# Patient Record
Sex: Female | Born: 1985 | Race: White | Hispanic: No | Marital: Married | Smoking: Former smoker
Health system: Southern US, Community
[De-identification: ages and names within clinical notes are randomized; demographics above are authoritative.]

## PROBLEM LIST (undated history)

## (undated) ENCOUNTER — Inpatient Hospital Stay (HOSPITAL_COMMUNITY): Payer: Self-pay

## (undated) ENCOUNTER — Emergency Department (HOSPITAL_COMMUNITY): Admission: EM | Payer: Managed Care, Other (non HMO) | Source: Home / Self Care

## (undated) DIAGNOSIS — N200 Calculus of kidney: Secondary | ICD-10-CM

## (undated) DIAGNOSIS — F32A Depression, unspecified: Secondary | ICD-10-CM

## (undated) DIAGNOSIS — R799 Abnormal finding of blood chemistry, unspecified: Secondary | ICD-10-CM

## (undated) DIAGNOSIS — F329 Major depressive disorder, single episode, unspecified: Secondary | ICD-10-CM

## (undated) DIAGNOSIS — E282 Polycystic ovarian syndrome: Secondary | ICD-10-CM

## (undated) DIAGNOSIS — Z8619 Personal history of other infectious and parasitic diseases: Secondary | ICD-10-CM

## (undated) DIAGNOSIS — O468X1 Other antepartum hemorrhage, first trimester: Secondary | ICD-10-CM

## (undated) DIAGNOSIS — M549 Dorsalgia, unspecified: Secondary | ICD-10-CM

## (undated) DIAGNOSIS — L821 Other seborrheic keratosis: Secondary | ICD-10-CM

## (undated) DIAGNOSIS — K649 Unspecified hemorrhoids: Secondary | ICD-10-CM

## (undated) DIAGNOSIS — O418X10 Other specified disorders of amniotic fluid and membranes, first trimester, not applicable or unspecified: Secondary | ICD-10-CM

## (undated) DIAGNOSIS — O209 Hemorrhage in early pregnancy, unspecified: Secondary | ICD-10-CM

## (undated) DIAGNOSIS — R7989 Other specified abnormal findings of blood chemistry: Secondary | ICD-10-CM

## (undated) DIAGNOSIS — O9902 Anemia complicating childbirth: Secondary | ICD-10-CM

## (undated) DIAGNOSIS — R51 Headache: Secondary | ICD-10-CM

## (undated) DIAGNOSIS — M797 Fibromyalgia: Secondary | ICD-10-CM

## (undated) HISTORY — DX: Abnormal finding of blood chemistry, unspecified: R79.9

## (undated) HISTORY — DX: Unspecified hemorrhoids: K64.9

## (undated) HISTORY — PX: KNEE SURGERY: SHX244

## (undated) HISTORY — DX: Other specified abnormal findings of blood chemistry: R79.89

## (undated) HISTORY — DX: Personal history of other infectious and parasitic diseases: Z86.19

## (undated) HISTORY — PX: WISDOM TOOTH EXTRACTION: SHX21

## (undated) HISTORY — DX: Dorsalgia, unspecified: M54.9

## (undated) HISTORY — DX: Fibromyalgia: M79.7

## (undated) HISTORY — DX: Other seborrheic keratosis: L82.1

---
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
## 2004-06-12 ENCOUNTER — Emergency Department (HOSPITAL_COMMUNITY): Admission: EM | Admit: 2004-06-12 | Discharge: 2004-06-12 | Payer: Self-pay | Admitting: Emergency Medicine

## 2004-08-08 ENCOUNTER — Ambulatory Visit (HOSPITAL_COMMUNITY): Admission: RE | Admit: 2004-08-08 | Discharge: 2004-08-08 | Payer: Self-pay | Admitting: *Deleted

## 2005-09-30 ENCOUNTER — Other Ambulatory Visit: Admission: RE | Admit: 2005-09-30 | Discharge: 2005-09-30 | Payer: Self-pay | Admitting: Family Medicine

## 2005-10-07 ENCOUNTER — Encounter: Admission: RE | Admit: 2005-10-07 | Discharge: 2005-10-07 | Payer: Self-pay | Admitting: Family Medicine

## 2005-10-24 ENCOUNTER — Emergency Department (HOSPITAL_COMMUNITY): Admission: EM | Admit: 2005-10-24 | Discharge: 2005-10-24 | Payer: Self-pay | Admitting: Emergency Medicine

## 2008-07-29 ENCOUNTER — Emergency Department (HOSPITAL_COMMUNITY): Admission: EM | Admit: 2008-07-29 | Discharge: 2008-07-29 | Payer: Self-pay | Admitting: Emergency Medicine

## 2008-09-17 ENCOUNTER — Emergency Department (HOSPITAL_COMMUNITY): Admission: EM | Admit: 2008-09-17 | Discharge: 2008-09-17 | Payer: Self-pay | Admitting: Emergency Medicine

## 2008-12-12 ENCOUNTER — Other Ambulatory Visit: Admission: RE | Admit: 2008-12-12 | Discharge: 2008-12-12 | Payer: Self-pay | Admitting: Obstetrics and Gynecology

## 2009-01-03 ENCOUNTER — Emergency Department (HOSPITAL_COMMUNITY): Admission: EM | Admit: 2009-01-03 | Discharge: 2009-01-03 | Payer: Self-pay | Admitting: Emergency Medicine

## 2009-09-29 ENCOUNTER — Emergency Department (HOSPITAL_COMMUNITY): Admission: EM | Admit: 2009-09-29 | Discharge: 2009-09-29 | Payer: Self-pay | Admitting: Emergency Medicine

## 2010-12-16 ENCOUNTER — Emergency Department (HOSPITAL_COMMUNITY)
Admission: EM | Admit: 2010-12-16 | Discharge: 2010-12-16 | Payer: Self-pay | Source: Home / Self Care | Admitting: Emergency Medicine

## 2010-12-17 LAB — CBC
HCT: 37.8 % (ref 36.0–46.0)
Hemoglobin: 12.8 g/dL (ref 12.0–15.0)
MCH: 29.5 pg (ref 26.0–34.0)
MCHC: 33.9 g/dL (ref 30.0–36.0)
MCV: 87.1 fL (ref 78.0–100.0)
Platelets: 294 10*3/uL (ref 150–400)
RBC: 4.34 MIL/uL (ref 3.87–5.11)
RDW: 13.4 % (ref 11.5–15.5)
WBC: 15.8 10*3/uL — ABNORMAL HIGH (ref 4.0–10.5)

## 2010-12-17 LAB — DIFFERENTIAL
Basophils Absolute: 0 10*3/uL (ref 0.0–0.1)
Basophils Relative: 0 % (ref 0–1)
Eosinophils Absolute: 0.1 10*3/uL (ref 0.0–0.7)
Eosinophils Relative: 0 % (ref 0–5)
Lymphocytes Relative: 10 % — ABNORMAL LOW (ref 12–46)
Lymphs Abs: 1.6 10*3/uL (ref 0.7–4.0)
Monocytes Absolute: 0.7 10*3/uL (ref 0.1–1.0)
Monocytes Relative: 5 % (ref 3–12)
Neutro Abs: 13.4 10*3/uL — ABNORMAL HIGH (ref 1.7–7.7)
Neutrophils Relative %: 85 % — ABNORMAL HIGH (ref 43–77)

## 2010-12-17 LAB — URINE MICROSCOPIC-ADD ON

## 2010-12-17 LAB — URINALYSIS, ROUTINE W REFLEX MICROSCOPIC
Bilirubin Urine: NEGATIVE
Leukocytes, UA: NEGATIVE
Nitrite: NEGATIVE
Protein, ur: NEGATIVE mg/dL
Specific Gravity, Urine: >1.03 — ABNORMAL HIGH (ref 1.005–1.030)
Urine Glucose, Fasting: NEGATIVE mg/dL
Urobilinogen, UA: 0.2 mg/dL (ref 0.0–1.0)
pH: 5 (ref 5.0–8.0)

## 2010-12-17 LAB — BASIC METABOLIC PANEL
BUN: 21 mg/dL (ref 6–23)
CO2: 25 mEq/L (ref 19–32)
Calcium: 8.5 mg/dL (ref 8.4–10.5)
Chloride: 107 mEq/L (ref 96–112)
Creatinine, Ser: 0.99 mg/dL (ref 0.4–1.2)
GFR calc Af Amer: >60 mL/min (ref >60)
GFR calc non Af Amer: >60 mL/min (ref >60)
Glucose, Bld: 117 mg/dL — ABNORMAL HIGH (ref 70–99)
Potassium: 4.5 mEq/L (ref 3.5–5.1)
Sodium: 138 mEq/L (ref 135–145)

## 2010-12-17 LAB — GLUCOSE, CAPILLARY: Glucose-Capillary: 111 mg/dL — ABNORMAL HIGH (ref 70–99)

## 2010-12-17 LAB — POCT PREGNANCY, URINE: Preg Test, Ur: NEGATIVE

## 2011-02-06 ENCOUNTER — Emergency Department (HOSPITAL_COMMUNITY)
Admission: EM | Admit: 2011-02-06 | Discharge: 2011-02-06 | Disposition: A | Discharge status: Home / Self Care | Payer: Managed Care, Other (non HMO) | Attending: Emergency Medicine | Admitting: Emergency Medicine

## 2011-02-06 ENCOUNTER — Emergency Department (HOSPITAL_COMMUNITY): Payer: Managed Care, Other (non HMO)

## 2011-02-06 DIAGNOSIS — N289 Disorder of kidney and ureter, unspecified: Secondary | ICD-10-CM | POA: Insufficient documentation

## 2011-02-06 DIAGNOSIS — N201 Calculus of ureter: Secondary | ICD-10-CM | POA: Insufficient documentation

## 2011-02-06 DIAGNOSIS — Z8742 Personal history of other diseases of the female genital tract: Secondary | ICD-10-CM | POA: Insufficient documentation

## 2011-02-06 DIAGNOSIS — R110 Nausea: Secondary | ICD-10-CM | POA: Insufficient documentation

## 2011-02-06 DIAGNOSIS — R109 Unspecified abdominal pain: Secondary | ICD-10-CM | POA: Insufficient documentation

## 2011-02-06 LAB — BASIC METABOLIC PANEL
BUN: 12 mg/dL (ref 6–23)
CO2: 23 mEq/L (ref 19–32)
Calcium: 9.3 mg/dL (ref 8.4–10.5)
Chloride: 103 mEq/L (ref 96–112)
Creatinine, Ser: 1.41 mg/dL — ABNORMAL HIGH (ref 0.4–1.2)
GFR calc Af Amer: 55 mL/min — ABNORMAL LOW (ref >60)
GFR calc non Af Amer: 45 mL/min — ABNORMAL LOW (ref >60)
Glucose, Bld: 104 mg/dL — ABNORMAL HIGH (ref 70–99)
Potassium: 4.1 mEq/L (ref 3.5–5.1)
Sodium: 135 mEq/L (ref 135–145)

## 2011-02-06 LAB — CBC
HCT: 35.4 % — ABNORMAL LOW (ref 36.0–46.0)
Hemoglobin: 11.9 g/dL — ABNORMAL LOW (ref 12.0–15.0)
MCH: 28.7 pg (ref 26.0–34.0)
MCHC: 33.6 g/dL (ref 30.0–36.0)
MCV: 85.5 fL (ref 78.0–100.0)
Platelets: 390 10*3/uL (ref 150–400)
RBC: 4.14 MIL/uL (ref 3.87–5.11)
RDW: 12.3 % (ref 11.5–15.5)
WBC: 13.4 10*3/uL — ABNORMAL HIGH (ref 4.0–10.5)

## 2011-02-06 LAB — DIFFERENTIAL
Basophils Absolute: 0 K/uL (ref 0.0–0.1)
Basophils Relative: 0 % (ref 0–1)
Eosinophils Absolute: 0 10*3/uL (ref 0.0–0.7)
Eosinophils Relative: 0 % (ref 0–5)
Lymphocytes Relative: 20 % (ref 12–46)
Lymphs Abs: 2.7 10*3/uL (ref 0.7–4.0)
Monocytes Absolute: 1 10*3/uL (ref 0.1–1.0)
Monocytes Relative: 7 % (ref 3–12)
Neutro Abs: 9.6 10*3/uL — ABNORMAL HIGH (ref 1.7–7.7)
Neutrophils Relative %: 72 % (ref 43–77)

## 2011-02-06 LAB — URINALYSIS, ROUTINE W REFLEX MICROSCOPIC
Bilirubin Urine: NEGATIVE
Glucose, UA: NEGATIVE mg/dL
Hgb urine dipstick: NEGATIVE
Ketones, ur: NEGATIVE mg/dL
Nitrite: NEGATIVE
Protein, ur: NEGATIVE mg/dL
Specific Gravity, Urine: 1.015 (ref 1.005–1.030)
Urobilinogen, UA: 0.2 mg/dL (ref 0.0–1.0)
pH: 5 (ref 5.0–8.0)

## 2011-02-06 LAB — D-DIMER, QUANTITATIVE: D-Dimer, Quant: 1.39 ug/mL-FEU — ABNORMAL HIGH (ref 0.00–0.48)

## 2011-02-10 ENCOUNTER — Emergency Department (HOSPITAL_COMMUNITY)
Admission: EM | Admit: 2011-02-10 | Discharge: 2011-02-10 | Disposition: A | Discharge status: Home / Self Care | Payer: Managed Care, Other (non HMO) | Attending: Emergency Medicine | Admitting: Emergency Medicine

## 2011-02-10 DIAGNOSIS — Z79899 Other long term (current) drug therapy: Secondary | ICD-10-CM | POA: Insufficient documentation

## 2011-02-10 DIAGNOSIS — N200 Calculus of kidney: Secondary | ICD-10-CM | POA: Insufficient documentation

## 2011-02-10 DIAGNOSIS — R109 Unspecified abdominal pain: Secondary | ICD-10-CM | POA: Insufficient documentation

## 2011-02-10 LAB — URINALYSIS, ROUTINE W REFLEX MICROSCOPIC
Bilirubin Urine: NEGATIVE
Glucose, UA: NEGATIVE mg/dL
Ketones, ur: NEGATIVE mg/dL
Leukocytes, UA: NEGATIVE
Nitrite: NEGATIVE
Protein, ur: NEGATIVE mg/dL
Specific Gravity, Urine: 1.02 (ref 1.005–1.030)
Urobilinogen, UA: 0.2 mg/dL (ref 0.0–1.0)
pH: 5.5 (ref 5.0–8.0)

## 2011-02-10 LAB — URINE MICROSCOPIC-ADD ON

## 2011-02-10 LAB — POCT PREGNANCY, URINE: Preg Test, Ur: NEGATIVE

## 2011-03-05 LAB — URINALYSIS, ROUTINE W REFLEX MICROSCOPIC
Bilirubin Urine: NEGATIVE
Nitrite: POSITIVE — AB
Specific Gravity, Urine: 1.015 (ref 1.005–1.030)
Urobilinogen, UA: 0.2 mg/dL (ref 0.0–1.0)
pH: 6 (ref 5.0–8.0)

## 2011-03-05 LAB — DIFFERENTIAL
Eosinophils Absolute: 0 10*3/uL (ref 0.0–0.7)
Lymphocytes Relative: 9 % — ABNORMAL LOW (ref 12–46)
Lymphs Abs: 1.2 10*3/uL (ref 0.7–4.0)
Monocytes Relative: 9 % (ref 3–12)
Neutro Abs: 11.1 10*3/uL — ABNORMAL HIGH (ref 1.7–7.7)
Neutrophils Relative %: 82 % — ABNORMAL HIGH (ref 43–77)

## 2011-03-05 LAB — CBC: MCV: 85.3 fL (ref 78.0–100.0)

## 2011-03-05 LAB — PREGNANCY, URINE: Preg Test, Ur: NEGATIVE

## 2011-03-05 LAB — BASIC METABOLIC PANEL
BUN: 6 mg/dL (ref 6–23)
CO2: 23 mEq/L (ref 19–32)
Calcium: 8.3 mg/dL — ABNORMAL LOW (ref 8.4–10.5)
Glucose, Bld: 112 mg/dL — ABNORMAL HIGH (ref 70–99)
Potassium: 3.7 mEq/L (ref 3.5–5.1)
Sodium: 132 mEq/L — ABNORMAL LOW (ref 135–145)

## 2011-03-05 LAB — URINE MICROSCOPIC-ADD ON

## 2011-03-17 LAB — URINALYSIS, ROUTINE W REFLEX MICROSCOPIC
Bilirubin Urine: NEGATIVE
Ketones, ur: 15 mg/dL — AB
Nitrite: NEGATIVE
Urobilinogen, UA: 0.2 mg/dL (ref 0.0–1.0)

## 2011-03-17 LAB — CBC
HCT: 39.8 % (ref 36.0–46.0)
Hemoglobin: 13.5 g/dL (ref 12.0–15.0)
MCHC: 33.9 g/dL (ref 30.0–36.0)
MCV: 85.5 fL (ref 78.0–100.0)
RBC: 4.65 MIL/uL (ref 3.87–5.11)
RDW: 13.4 % (ref 11.5–15.5)

## 2011-03-17 LAB — BASIC METABOLIC PANEL
CO2: 22 mEq/L (ref 19–32)
Chloride: 104 mEq/L (ref 96–112)
Glucose, Bld: 120 mg/dL — ABNORMAL HIGH (ref 70–99)
Potassium: 3.8 mEq/L (ref 3.5–5.1)
Sodium: 138 mEq/L (ref 135–145)

## 2011-03-17 LAB — DIFFERENTIAL
Basophils Absolute: 0 10*3/uL (ref 0.0–0.1)
Basophils Relative: 0 % (ref 0–1)
Eosinophils Relative: 0 % (ref 0–5)
Monocytes Absolute: 0.4 10*3/uL (ref 0.1–1.0)
Monocytes Relative: 4 % (ref 3–12)

## 2011-03-17 LAB — URINE MICROSCOPIC-ADD ON

## 2011-04-17 NOTE — Cardiovascular Report (Signed)
NAMELiliane Mercer, Adonna                              ACCOUNT NO.:  [id number]   MEDICAL RECORD NO.:  [id number]                   PATIENT TYPE:  OIB   LOCATION:  2899                                 FACILITY:  [facility]   PHYSICIAN:  Darlin Priestly, M.D.             DATE OF BIRTH:  [birth year]   DATE OF PROCEDURE:  08/08/2004  DATE OF DISCHARGE:  08/08/2004                              CARDIAC CATHETERIZATION   PROCEDURE:  Head-up tilt-table test with __________ infusion.   ATTENDING:  Dr. Lenise Herald.   INDICATIONS:  [name] is a 25 year old female, patient of Dr. Vonna Kotyk R.  Jacinto Halim, Dr. Louanna Raw, and Dr. Marny Lowenstein with a history of anxiety  disorder, initially seen for chest pain and shortness of breath.  She also  is complaining of at least three episodes where she felt her heart was  racing, and she became presyncopal.   Dictation ended at this point.                                               Darlin Priestly, M.D.    RHM/MEDQ  D:  08/08/2004  T:  08/09/2004  Job:  [id number]

## 2011-04-17 NOTE — Cardiovascular Report (Signed)
NAME:  Vanessa Mercer, Vanessa Mercer                              ACCOUNT NO.:  [id number]   MEDICAL RECORD NO.:  [id number]                   PATIENT TYPE:  OIB   LOCATION:  2899                                 FACILITY:  [facility]   PHYSICIAN:  Darlin Priestly, M.D.             DATE OF BIRTH:  1986-04-14   DATE OF PROCEDURE:  DATE OF DISCHARGE:  08/08/2004                              CARDIAC CATHETERIZATION   PROCEDURE:  Head-up tilt table testing with Isuprel infusion.   ATTENDING PHYSICIAN:  Dr. Lenise Herald.   INDICATION:  [name] is a 25 year old female patient of Dr. Louanna Raw,  Dr. Yates Decamp, initially referred to him for chest pain, tachy palpitations  and presyncopal symptoms.   She did subsequently undergo Cardiolite scan in August of 2005 revealing no  significant ischemia with a normal EF.  She is now referred for tilt-table  testing to rule out neurocardiogenic syncope.   DESCRIPTION OF PROCEDURE:  After giving informed consent, the patient was  brought to the cardiac catheterization laboratory in a fasting state.  The  patient was then placed in the supine position, and hemodynamic measurements  were obtained.  The resting heart rate was 72.  The resting blood pressure  was 113/67.  The patient was then tilted to 70-degree heads up position  which was maintained for 30 minutes.  She remained hemodynamically stable  with no symptoms.  The patient was then returned to a supine position, and  Isuprel infusion was then begun at 0.5 micrograms per minute.  This was  ultimately up-titrated to 1 microgram per minute.  After approximately 10  minutes, the patient was then returned to a 70-degree head-up position.  She  again remained hemodynamically stable with no significant drop in blood  pressure.  She did complain of some mild nausea, but no presyncopal  symptoms.  Isuprel infusion was then discontinued.  The patient returned to  a supine position.  She remained hemodynamically  stable.  She was returned  to the recovery room in stable condition.   CONCLUSION:  Negative head-up tilt-table testing with Isuprel infusion.                                               Darlin Priestly, M.D.    RHM/MEDQ  D:  08/08/2004  T:  08/09/2004  Job:  [id number]

## 2011-07-02 LAB — GC/CHLAMYDIA PROBE AMP, GENITAL: Chlamydia: NEGATIVE

## 2011-07-02 LAB — HEPATITIS B SURFACE ANTIGEN: Hepatitis B Surface Ag: NEGATIVE

## 2011-07-02 LAB — ABO/RH: RH Type: POSITIVE

## 2011-07-02 LAB — RUBELLA ANTIBODY, IGM: Rubella: IMMUNE

## 2011-07-19 ENCOUNTER — Inpatient Hospital Stay (HOSPITAL_COMMUNITY)
Admission: AD | Admit: 2011-07-19 | Discharge: 2011-07-19 | Disposition: A | Discharge status: Home / Self Care | Payer: Managed Care, Other (non HMO) | Source: Ambulatory Visit | Attending: Obstetrics and Gynecology | Admitting: Obstetrics and Gynecology

## 2011-07-19 ENCOUNTER — Encounter (HOSPITAL_COMMUNITY): Payer: Self-pay | Admitting: *Deleted

## 2011-07-19 DIAGNOSIS — O99891 Other specified diseases and conditions complicating pregnancy: Secondary | ICD-10-CM | POA: Insufficient documentation

## 2011-07-19 DIAGNOSIS — R21 Rash and other nonspecific skin eruption: Secondary | ICD-10-CM | POA: Insufficient documentation

## 2011-07-19 NOTE — ED Provider Notes (Signed)
History     Chief Complaint  Patient presents with  . Rash   HPI 25 yo G1P0 WF @ 14 wk ggestation presents for evaluation of poss allergy to egg. Pt had scrambled eggs this am. She went abck to bed and then got up and had vomiting then went back to bed and woke up w/ facial rash around nose. No prob with eggs or flu vaccine in past.no diarrhea. No facial or hand swelling. Not itching. No SOB. Pt also had cheese and yogurt as well  OB History    Grav Para Term Preterm Abortions TAB SAB Ect Mult Living   1               No past medical history on file.  No past surgical history on file.  No family history on file.  History  Substance Use Topics  . Smoking status: Not on file  . Smokeless tobacco: Not on file  . Alcohol Use: Not on file    Allergies: No Known Allergies  No prescriptions prior to admission    ROSneg Physical Exam   Blood pressure 112/72, pulse 84, temperature 98.8 F (37.1 C), temperature source Oral, resp. rate 18, height 5\' 3"  (1.6 m), weight 77.474 kg (170 lb 12.8 oz), last menstrual period 04/10/2011.  Physical Exam  Constitutional: She appears well-developed and well-nourished. No distress.  Eyes: EOM are normal.  Skin: Rash noted.       Erythematous rash around nose and upper bridge  (+) FHR 155  MAU Course  Procedures  MDM   Assessment and Plan  Rash ? Allergic reaction Pregnant.  P) advise allergy testing when poss as will not be able to use flu vaccine if allergic  Addisen Chappelle A 07/19/2011, 3:13 PM

## 2011-07-19 NOTE — Progress Notes (Signed)
Pt stated she woke up this morning had some eggs for breakfast. Went back to bed and woke up and started vomiting. Then went back to bed and woke up at 1pm and noticed a rash on her face.

## 2011-11-09 ENCOUNTER — Encounter (HOSPITAL_COMMUNITY): Payer: Self-pay | Admitting: *Deleted

## 2011-11-09 ENCOUNTER — Inpatient Hospital Stay (HOSPITAL_COMMUNITY)
Admission: AD | Admit: 2011-11-09 | Discharge: 2011-11-09 | Disposition: A | Discharge status: Home / Self Care | Payer: Managed Care, Other (non HMO) | Source: Ambulatory Visit | Attending: Obstetrics & Gynecology | Admitting: Obstetrics & Gynecology

## 2011-11-09 DIAGNOSIS — O2390 Unspecified genitourinary tract infection in pregnancy, unspecified trimester: Secondary | ICD-10-CM | POA: Insufficient documentation

## 2011-11-09 DIAGNOSIS — O4700 False labor before 37 completed weeks of gestation, unspecified trimester: Secondary | ICD-10-CM | POA: Insufficient documentation

## 2011-11-09 DIAGNOSIS — N390 Urinary tract infection, site not specified: Secondary | ICD-10-CM | POA: Insufficient documentation

## 2011-11-09 HISTORY — DX: Depression, unspecified: F32.A

## 2011-11-09 HISTORY — DX: Headache: R51

## 2011-11-09 HISTORY — DX: Polycystic ovarian syndrome: E28.2

## 2011-11-09 HISTORY — DX: Calculus of kidney: N20.0

## 2011-11-09 HISTORY — DX: Major depressive disorder, single episode, unspecified: F32.9

## 2011-11-09 LAB — URINALYSIS, ROUTINE W REFLEX MICROSCOPIC
Glucose, UA: NEGATIVE mg/dL
Leukocytes, UA: NEGATIVE
Protein, ur: NEGATIVE mg/dL
Specific Gravity, Urine: >1.03 — ABNORMAL HIGH (ref 1.005–1.030)
Urobilinogen, UA: 0.2 mg/dL (ref 0.0–1.0)

## 2011-11-09 LAB — URINE MICROSCOPIC-ADD ON

## 2011-11-09 MED ORDER — CEPHALEXIN 500 MG PO CAPS: 500.0000 mg | ORAL_CAPSULE | Freq: Two times a day (BID) | ORAL | Status: AC

## 2011-11-09 MED ORDER — NIFEDIPINE 10 MG PO CAPS: 10.0000 mg | ORAL_CAPSULE | Freq: Three times a day (TID) | ORAL | Status: DC

## 2011-11-09 MED ORDER — TERBUTALINE SULFATE 1 MG/ML IJ SOLN
0.2500 mg | Freq: Once | INTRAMUSCULAR | Status: AC
  Administered 2011-11-09: 0.25 mg via SUBCUTANEOUS
  Filled 2011-11-09: qty 1

## 2011-11-09 NOTE — ED Provider Notes (Signed)
History   HPI 25 yo, G1 at 30 wks, here for painful UCs since this afternoon. Intercourse this morning, worked all day (walks a lot at work), started having UCs. No vag pressure/ bleeding/ loss of fluid. Good FMs. Passed 3 hr GTT recently in office, PCOS hx.  H/o renal stones, right side, last passed in Feb'12. C/o slight right side back pain, no fever/ chills/ UTI s/s and no UTI inthis pregnancy.   Past Medical History  Diagnosis Date  . Headache   . PCOS (polycystic ovarian syndrome)   . Depression   . Kidney stones    Past Surgical History  Procedure Date  . Clavicle surgery left knee  . Wisdom tooth extraction    Family History  Problem Relation Age of Onset  . Alcohol abuse Father   . Heart disease Maternal Grandmother   . Diabetes Maternal Grandfather   . Diabetes Paternal Grandfather    History  Substance Use Topics  . Smoking status: Former Smoker -- 0.5 packs/day for 2 years    Types: Cigarettes  . Smokeless tobacco: Not on file  . Alcohol Use: No   Allergies: No Known Allergies  Prescriptions prior to admission  Medication Sig Dispense Refill  . acetaminophen (TYLENOL) 500 MG tablet Take 500 mg by mouth every 6 (six) hours as needed. Patient took this medication for migraine headache.       . butalbital-acetaminophen-caffeine (FIORICET, ESGIC) 50-325-40 MG per tablet Take 1 tablet by mouth 2 (two) times daily as needed. Patient uses this medication for migraines.       . Pediatric Multivit-Minerals-C (FLINTSTONES GUMMIES PO) Take 1 tablet by mouth daily.          ROS  Nausea since arriving here, no diarrhea or vomiting.   Physical Exam   Blood pressure 120/78, pulse 103, temperature 98.1 F (36.7 C), temperature source Oral, resp. rate 18, height 5\' 3"  (1.6 m), weight 178 lb (80.74 kg), last menstrual period 04/10/2011.  Physical Exam A&O x 3, no acute distress. Pleasant CV RRR, A1S2 normal Abdo soft, non tender, non acute, gravid, mild palpable UCs.  Extr  no edema/ tenderness Pelvic Cx closed/ soft/ long/ posterior Station high. No bleeding or abn discharg (unchanged over 1 hr) FHT  150s/ min to moderate variability/ accels 10x10/ occ variable decel. Overall reassuring.  Toco irritability  MAU Course  Procedures Terbutaline 0.25 mg sq helped space out UCs and are milder since.  UA- possible UTI, dehydration, possible renal stone related hematuria.   Assessment and Plan  Will treat with Keflex 500 mg PO bid x 5 days and send urine c&s.  Procardia 10 mg PO tid x10 days until next office visit Pelvic rest, hydration, pyelo and stone precautions.  PTL precautions, FAC.   Kodi Steil R 11/09/2011, 7:47 PM

## 2011-11-09 NOTE — Progress Notes (Signed)
Pt states she has had worsening ctx throughout the day while at work.

## 2011-12-01 NOTE — L&D Delivery Note (Signed)
Delivery Note At 9:38 PM a viable female was delivered via Vaginal, Spontaneous Delivery (Presentation: Right Occiput Anterior).  APGAR: 5, 9; weight 7 lb 4 oz (3289 g).   Placenta status: Intact, Spontaneous.  Cord: 3 vessels. For the past 1.5 hrs, variable decels noted with pushing, decels up to 1 minute to 60-80's followed by rapid recovery to baseline of 120s. Nuchal cord suspected. OA position confirmed, no GDM but elevated 1 hr GTT and EFW 8'4 reviewed. Over the last 5 minutes pt had decel with recovery but loss of variability. This was followed by terminal bradycardia to 60's. O2 on, Pt encouraged to continue pushing and R mediolateral episiotomy cut. Head delivered, tight nuchal noted, peds called as infant dusky. 30 second shoulder dystocia, relieved w/ McRoberts, R shoulder anterior. Remainder of body delivered w/o complication and baby to RN, followed by peds 1-2 minutes later Cord pH: 7.17. Peds aware of shoulder dystocia, no concerns about infant.   Anesthesia: Epidural  Episiotomy: Right Mediolateral Lacerations: 2nd degree Suture Repair: 2.0 3.0 vicryl rapide, deep layer w/ 2-0, standard episiotoy repair w/ 3-0. Rectal exam confirmed no muscle damage.  Est. Blood Loss (mL): 500  Mom to postpartum.  Baby to nursery-stable.  Vanessa Mercer A. 01/19/2012, 10:04 PM

## 2011-12-11 ENCOUNTER — Encounter (HOSPITAL_COMMUNITY): Payer: Self-pay | Admitting: *Deleted

## 2011-12-11 ENCOUNTER — Inpatient Hospital Stay (HOSPITAL_COMMUNITY)
Admission: AD | Admit: 2011-12-11 | Discharge: 2011-12-11 | Disposition: A | Discharge status: Home / Self Care | Payer: Managed Care, Other (non HMO) | Source: Ambulatory Visit | Attending: Obstetrics | Admitting: Obstetrics

## 2011-12-11 DIAGNOSIS — O4700 False labor before 37 completed weeks of gestation, unspecified trimester: Secondary | ICD-10-CM | POA: Insufficient documentation

## 2011-12-11 LAB — URINALYSIS, ROUTINE W REFLEX MICROSCOPIC
Bilirubin Urine: NEGATIVE
Glucose, UA: NEGATIVE mg/dL
Hgb urine dipstick: NEGATIVE
Ketones, ur: NEGATIVE mg/dL
Specific Gravity, Urine: <1.005 — ABNORMAL LOW (ref 1.005–1.030)
pH: 6 (ref 5.0–8.0)

## 2011-12-11 LAB — URINE MICROSCOPIC-ADD ON

## 2011-12-11 LAB — URINE CULTURE
Colony Count: 15000
Culture  Setup Time: 201301111022

## 2011-12-11 NOTE — Progress Notes (Signed)
PT SAYS SHE HURTS BAD  SINCE 2330-     FEELS PRESSURE.  NO VE IN OFFICE.   HAD PTL AT 30 WEEKS.  DENIES HSV AND MRSA.

## 2011-12-11 NOTE — H&P (Signed)
CC: ctx  HPI: 26 yo G1 at 34'5 presents w/ ctx q 5-8 min at home, starting at 11 pm. Pt concerned about PTL, had prior episode of PT ctx, w/o cervical change and had been given procardia in past. Pt no longer taking as GA increased, no data for long term procardia and side effects. Pt notes ctx have gotten less freq whil here in MAU and now only q 15 min. No LOF, no VB, good FM. No vag d/c, no fevers, no constipation, no dysuria, No sx other than ctx. Pt states very hydrated.  PMH: obesity, prior infertiltiy w/ OHSS  NKDA  PE:  Filed Vitals:   12/11/11 0300 12/11/11 0306  BP: 115/85 126/86  Pulse: 89 73  Temp: 98.1 F (36.7 C)   TempSrc: Oral   Height: 5\' 4"  (1.626 m)   Weight: 84.142 kg (185 lb 8 oz)     Gen: no distress, comfortable CV: RRR Pullm: CTAB Back: no CVAT Abd: gravid, NT GU: no pool, small vag d/c, cvx long, closed, vtx high, no CMT, no adenxal masses   FH 120's, + accels, no decels, 10 beat var Toco: q 10-15 over the past hr  Wet Prep: 10 WBC/ hpf, no clue, no yeast, no BV  A/P: 34'5 w/ ctx but no evidence labor, chorio, ROM, vaginal infection, UTI. Reactive fetal testing. F/u as scheduled in office. Routine labor precautions.  Akyah Lagrange A. 12/11/2011 5:49 AM

## 2012-01-07 ENCOUNTER — Inpatient Hospital Stay (HOSPITAL_COMMUNITY)
Admission: AD | Admit: 2012-01-07 | Discharge: 2012-01-07 | Disposition: A | Discharge status: Home / Self Care | Payer: Managed Care, Other (non HMO) | Source: Ambulatory Visit | Attending: Obstetrics and Gynecology | Admitting: Obstetrics and Gynecology

## 2012-01-07 ENCOUNTER — Encounter (HOSPITAL_COMMUNITY): Payer: Self-pay | Admitting: *Deleted

## 2012-01-07 DIAGNOSIS — O479 False labor, unspecified: Secondary | ICD-10-CM | POA: Insufficient documentation

## 2012-01-07 NOTE — Progress Notes (Signed)
Pt presents to mau for labor check.  States ctx started around 1030pm last night.  Denies leaking.

## 2012-01-07 NOTE — Progress Notes (Signed)
Dr. Cherly Hensen notified of pt presenting for labor check.  Notified of VE 2/80/-2 with no cervical change after one hour.  Orders to dc home.

## 2012-01-11 ENCOUNTER — Other Ambulatory Visit: Payer: Self-pay | Admitting: Obstetrics

## 2012-01-11 ENCOUNTER — Encounter (HOSPITAL_COMMUNITY): Payer: Self-pay | Admitting: *Deleted

## 2012-01-11 ENCOUNTER — Telehealth (HOSPITAL_COMMUNITY): Payer: Self-pay | Admitting: *Deleted

## 2012-01-11 NOTE — Telephone Encounter (Signed)
Preadmission screen  

## 2012-01-19 ENCOUNTER — Encounter (HOSPITAL_COMMUNITY): Payer: Self-pay

## 2012-01-19 ENCOUNTER — Encounter (HOSPITAL_COMMUNITY): Payer: Self-pay | Admitting: Anesthesiology

## 2012-01-19 ENCOUNTER — Inpatient Hospital Stay (HOSPITAL_COMMUNITY)
Admission: RE | Admit: 2012-01-19 | Discharge: 2012-01-21 | DRG: 775 | Disposition: A | Discharge status: Home Health | Payer: Managed Care, Other (non HMO) | Source: Ambulatory Visit | Attending: Obstetrics | Admitting: Obstetrics

## 2012-01-19 ENCOUNTER — Inpatient Hospital Stay (HOSPITAL_COMMUNITY): Payer: Managed Care, Other (non HMO) | Admitting: Anesthesiology

## 2012-01-19 DIAGNOSIS — D649 Anemia, unspecified: Secondary | ICD-10-CM | POA: Diagnosis not present

## 2012-01-19 DIAGNOSIS — O409XX0 Polyhydramnios, unspecified trimester, not applicable or unspecified: Secondary | ICD-10-CM | POA: Diagnosis present

## 2012-01-19 DIAGNOSIS — O9902 Anemia complicating childbirth: Secondary | ICD-10-CM | POA: Diagnosis present

## 2012-01-19 DIAGNOSIS — O480 Post-term pregnancy: Principal | ICD-10-CM | POA: Diagnosis present

## 2012-01-19 DIAGNOSIS — O9903 Anemia complicating the puerperium: Secondary | ICD-10-CM | POA: Diagnosis not present

## 2012-01-19 HISTORY — DX: Anemia complicating childbirth: O99.02

## 2012-01-19 LAB — TYPE AND SCREEN
ABO/RH(D): A POS
Antibody Screen: NEGATIVE

## 2012-01-19 LAB — CBC
Platelets: 217 10*3/uL (ref 150–400)
RBC: 3.92 MIL/uL (ref 3.87–5.11)
WBC: 9.4 10*3/uL (ref 4.0–10.5)

## 2012-01-19 LAB — RPR: RPR Ser Ql: NONREACTIVE

## 2012-01-19 MED ORDER — ONDANSETRON HCL 4 MG PO TABS: 4.0000 mg | ORAL_TABLET | ORAL | Status: DC | PRN

## 2012-01-19 MED ORDER — TERBUTALINE SULFATE 1 MG/ML IJ SOLN: 0.2500 mg | Freq: Once | INTRAMUSCULAR | Status: DC | PRN

## 2012-01-19 MED ORDER — WITCH HAZEL-GLYCERIN EX PADS: 1.0000 "application " | MEDICATED_PAD | CUTANEOUS | Status: DC | PRN

## 2012-01-19 MED ORDER — PRENATAL MULTIVITAMIN CH
1.0000 | ORAL_TABLET | Freq: Every day | ORAL | Status: DC
  Administered 2012-01-21: 1 via ORAL
  Filled 2012-01-19: qty 1

## 2012-01-19 MED ORDER — IBUPROFEN 600 MG PO TABS
600.0000 mg | ORAL_TABLET | Freq: Four times a day (QID) | ORAL | Status: DC
  Administered 2012-01-20 – 2012-01-21 (×6): 600 mg via ORAL
  Filled 2012-01-19 (×6): qty 1

## 2012-01-19 MED ORDER — ZOLPIDEM TARTRATE 5 MG PO TABS: 5.0000 mg | ORAL_TABLET | Freq: Every evening | ORAL | Status: DC | PRN

## 2012-01-19 MED ORDER — LACTATED RINGERS IV SOLN
INTRAVENOUS | Status: DC
  Administered 2012-01-19 (×2): via INTRAVENOUS

## 2012-01-19 MED ORDER — OXYCODONE-ACETAMINOPHEN 5-325 MG PO TABS
1.0000 | ORAL_TABLET | ORAL | Status: DC | PRN
  Administered 2012-01-19: 1 via ORAL
  Filled 2012-01-19: qty 1

## 2012-01-19 MED ORDER — LIDOCAINE HCL (PF) 1 % IJ SOLN
30.0000 mL | INTRAMUSCULAR | Status: DC | PRN
  Administered 2012-01-19: 30 mL via SUBCUTANEOUS
  Filled 2012-01-19: qty 30

## 2012-01-19 MED ORDER — TETANUS-DIPHTH-ACELL PERTUSSIS 5-2.5-18.5 LF-MCG/0.5 IM SUSP
0.5000 mL | Freq: Once | INTRAMUSCULAR | Status: AC
  Administered 2012-01-21: 0.5 mL via INTRAMUSCULAR
  Filled 2012-01-19: qty 0.5

## 2012-01-19 MED ORDER — PHENYLEPHRINE 40 MCG/ML (10ML) SYRINGE FOR IV PUSH (FOR BLOOD PRESSURE SUPPORT)
80.0000 ug | PREFILLED_SYRINGE | INTRAVENOUS | Status: DC | PRN
  Filled 2012-01-19: qty 5

## 2012-01-19 MED ORDER — EPHEDRINE 5 MG/ML INJ: 10.0000 mg | INTRAVENOUS | Status: DC | PRN

## 2012-01-19 MED ORDER — BENZOCAINE-MENTHOL 20-0.5 % EX AERO
1.0000 "application " | INHALATION_SPRAY | CUTANEOUS | Status: DC | PRN
  Administered 2012-01-20: 1 via TOPICAL

## 2012-01-19 MED ORDER — ACETAMINOPHEN 325 MG PO TABS: 650.0000 mg | ORAL_TABLET | ORAL | Status: DC | PRN

## 2012-01-19 MED ORDER — LACTATED RINGERS IV SOLN
500.0000 mL | INTRAVENOUS | Status: DC | PRN
  Administered 2012-01-19: 500 mL via INTRAVENOUS

## 2012-01-19 MED ORDER — OXYTOCIN 20 UNITS IN LACTATED RINGERS INFUSION - SIMPLE: 1.0000 m[IU]/min | INTRAVENOUS | Status: DC

## 2012-01-19 MED ORDER — LANOLIN HYDROUS EX OINT: TOPICAL_OINTMENT | CUTANEOUS | Status: DC | PRN

## 2012-01-19 MED ORDER — ZOLPIDEM TARTRATE 10 MG PO TABS: 10.0000 mg | ORAL_TABLET | Freq: Every evening | ORAL | Status: DC | PRN

## 2012-01-19 MED ORDER — ONDANSETRON HCL 4 MG/2ML IJ SOLN: 4.0000 mg | INTRAMUSCULAR | Status: DC | PRN

## 2012-01-19 MED ORDER — PHENYLEPHRINE 40 MCG/ML (10ML) SYRINGE FOR IV PUSH (FOR BLOOD PRESSURE SUPPORT): 80.0000 ug | PREFILLED_SYRINGE | INTRAVENOUS | Status: DC | PRN

## 2012-01-19 MED ORDER — FLEET ENEMA 7-19 GM/118ML RE ENEM: 1.0000 | ENEMA | Freq: Every day | RECTAL | Status: DC | PRN

## 2012-01-19 MED ORDER — CITRIC ACID-SODIUM CITRATE 334-500 MG/5ML PO SOLN: 30.0000 mL | ORAL | Status: DC | PRN

## 2012-01-19 MED ORDER — SODIUM BICARBONATE 8.4 % IV SOLN
INTRAVENOUS | Status: DC | PRN
  Administered 2012-01-19: 4 mL via EPIDURAL

## 2012-01-19 MED ORDER — OXYTOCIN BOLUS FROM INFUSION
500.0000 mL | Freq: Once | INTRAVENOUS | Status: AC
  Administered 2012-01-19: 500 mL via INTRAVENOUS
  Filled 2012-01-19: qty 500

## 2012-01-19 MED ORDER — FLEET ENEMA 7-19 GM/118ML RE ENEM: 1.0000 | ENEMA | RECTAL | Status: DC | PRN

## 2012-01-19 MED ORDER — DIBUCAINE 1 % RE OINT: 1.0000 "application " | TOPICAL_OINTMENT | RECTAL | Status: DC | PRN

## 2012-01-19 MED ORDER — EPHEDRINE 5 MG/ML INJ
10.0000 mg | INTRAVENOUS | Status: DC | PRN
  Filled 2012-01-19: qty 4

## 2012-01-19 MED ORDER — SIMETHICONE 80 MG PO CHEW: 80.0000 mg | CHEWABLE_TABLET | ORAL | Status: DC | PRN

## 2012-01-19 MED ORDER — BUTORPHANOL TARTRATE 2 MG/ML IJ SOLN
1.0000 mg | INTRAMUSCULAR | Status: DC | PRN
  Administered 2012-01-19: 1 mg via INTRAVENOUS
  Filled 2012-01-19: qty 1

## 2012-01-19 MED ORDER — OXYCODONE-ACETAMINOPHEN 5-325 MG PO TABS
1.0000 | ORAL_TABLET | ORAL | Status: DC | PRN
  Administered 2012-01-20 – 2012-01-21 (×3): 1 via ORAL
  Filled 2012-01-19 (×3): qty 1

## 2012-01-19 MED ORDER — SENNOSIDES-DOCUSATE SODIUM 8.6-50 MG PO TABS
2.0000 | ORAL_TABLET | Freq: Every day | ORAL | Status: DC
  Administered 2012-01-20: 2 via ORAL

## 2012-01-19 MED ORDER — BISACODYL 10 MG RE SUPP: 10.0000 mg | Freq: Every day | RECTAL | Status: DC | PRN

## 2012-01-19 MED ORDER — ONDANSETRON HCL 4 MG/2ML IJ SOLN: 4.0000 mg | Freq: Four times a day (QID) | INTRAMUSCULAR | Status: DC | PRN

## 2012-01-19 MED ORDER — OXYTOCIN 20 UNITS IN LACTATED RINGERS INFUSION - SIMPLE
1.0000 m[IU]/min | INTRAVENOUS | Status: DC
  Administered 2012-01-19: 2 m[IU]/min via INTRAVENOUS
  Filled 2012-01-19: qty 1000

## 2012-01-19 MED ORDER — FENTANYL 2.5 MCG/ML BUPIVACAINE 1/10 % EPIDURAL INFUSION (WH - ANES)
14.0000 mL/h | INTRAMUSCULAR | Status: DC
  Administered 2012-01-19 (×2): 14 mL/h via EPIDURAL
  Filled 2012-01-19 (×3): qty 60

## 2012-01-19 MED ORDER — DIPHENHYDRAMINE HCL 50 MG/ML IJ SOLN: 12.5000 mg | INTRAMUSCULAR | Status: DC | PRN

## 2012-01-19 MED ORDER — DIPHENHYDRAMINE HCL 25 MG PO CAPS: 25.0000 mg | ORAL_CAPSULE | Freq: Four times a day (QID) | ORAL | Status: DC | PRN

## 2012-01-19 MED ORDER — LACTATED RINGERS IV SOLN: 500.0000 mL | Freq: Once | INTRAVENOUS | Status: DC

## 2012-01-19 MED ORDER — IBUPROFEN 600 MG PO TABS: 600.0000 mg | ORAL_TABLET | Freq: Four times a day (QID) | ORAL | Status: DC | PRN

## 2012-01-19 MED ORDER — FENTANYL 2.5 MCG/ML BUPIVACAINE 1/10 % EPIDURAL INFUSION (WH - ANES)
INTRAMUSCULAR | Status: DC | PRN
  Administered 2012-01-19: 13 mL/h via EPIDURAL

## 2012-01-19 MED ORDER — OXYTOCIN 20 UNITS IN LACTATED RINGERS INFUSION - SIMPLE: 125.0000 mL/h | Freq: Once | INTRAVENOUS | Status: DC

## 2012-01-19 NOTE — H&P (Signed)
Vanessa Mercer is a 26 y.o. G1P0 at [redacted]w[redacted]d presenting for IOL for post-dates and polyhydramnios. Pt notes rare contractions  . Good fetal movement, No vaginal bleeding, not leaking fluid .  [facility] at [facility] since 6 wks - h/o infertility, s/p multiple failed rounds OI/IUI, unplanned spontaneous conception with new partner - PCOS, off metformin prior to pregnancy, nl A1C, failed 1 hr GTT, passes 3 hr GTT - threatened PTL, short term Procardia use around 31 wks - h/o depression, stopped Wellbutrin at start of pregnancy - polyhydramnios. AFI 19/ 98% on 2/15 - growth. 2/11- 8'4, 80%, ant placenta  OB History    Grav Para Term Preterm Abortions TAB SAB Ect Mult Living   1              Past Medical History  Diagnosis Date  . Headache   . PCOS (polycystic ovarian syndrome)   . Depression   . Kidney stones   . Other nonspecific findings on examination of blood     elevated serum creatinine  . History of chicken pox   . Hypertension    Past Surgical History  Procedure Date  . Wisdom tooth extraction   . Knee surgery    Family History: family history includes Alcohol abuse in her father; Cancer in her paternal grandfather; Diabetes in her maternal grandfather and paternal grandfather; Heart disease in her maternal grandfather and maternal grandmother; Migraines in her mother and sister; and Urolithiasis in her mother. Social History:  reports that she quit smoking about 8 months ago. Her smoking use included Cigarettes. She has a 1 pack-year smoking history. She has never used smokeless tobacco. She reports that she does not drink alcohol or use illicit drugs.  Review of Systems - Negative     Last menstrual period 04/10/2011.  Physical Exam:  Gen: well appearing, no distress CV: RRR Pulm: CTAB Back: no CVAT Abd: gravid, NT, no RUQ pain LE: no edema, equal bilaterally, non-tender Toco: occ FH: baseline 120, accelerations present, no deceleratons, 10 beat variability Cvx:  3/50%/ vtx -2  Prenatal labs: ABO, Rh: A/Positive/-- (08/02 0000) Antibody: Negative (08/02 0000) Rubella:  immune RPR: Nonreactive (08/02 0000)  HBsAg: Negative (08/02 0000)  HIV: Non-reactive (08/02 0000)  GBS: Negative (01/16 0000)  1 hr Glucola high, nl 3 hr     Assessment/Plan: 26 y.o. G1P0 at [redacted]w[redacted]d - post EDC/ polyhudramnios. Plan IOL at this time. Pitcoin, careful ROM when in active labor - GBS neg - Monitor for PP depression, cont add back Wellbutrin   Ingeborg Fite A. 01/19/2012, 7:22 AM

## 2012-01-19 NOTE — Progress Notes (Signed)
S: Doing well, no complaints, pain well controlled with epidural  O: BP 122/82  Pulse 81  Temp(Src) 98.7 F (37.1 C) (Oral)  Resp 18  Ht 5\' 4"  (1.626 m)  Wt 86.183 kg (190 lb)  BMI 32.61 kg/m2  SpO2 100%  LMP 04/10/2011   FHT:  FHR: 120s bpm, variability: minimal ,  accelerations:  Present,  decelerations:  Present some late decels, occasional, resolve w/ position change, pt then goes long periods w/o decels. 8 beat variability, good scalp stim UC:   regular, every 2-3 minutes SVE:   Dilation: 10 Effacement (%): 100 Station: 0;+1 Exam by:: dr Ernestina Penna vtx 0-+1, caput/ molding at +1-+2, asynclitic   A / P:  26 y.o.  Obstetric History   G1   P0   T0   P0   A0   TAB0   SAB0   E0   M0   L0    at [redacted]w[redacted]d Induction of labor due to postterm and polyhydramnios,  progressing well on pitocin  Fetal Wellbeing:  Category II, overall reasurring but will watch closely Pain Control:  Epidural  Anticipated MOD:  NSVD, will monitor closely as asynclitic and molding. Plan to passively descend another 30 min then start pushing. Watch for late decels w/ pushing.   Vanessa Sena A. 01/19/2012, 7:47 PM

## 2012-01-19 NOTE — Progress Notes (Signed)
S: Doing well, no complaints, pain well controlled with epidural  O: BP 121/79  Pulse 83  Temp(Src) 98.2 F (36.8 C) (Oral)  Ht 5\' 4"  (1.626 m)  Wt 86.183 kg (190 lb)  BMI 32.61 kg/m2  SpO2 98%  LMP 04/10/2011   FHT:  FHR: 120 bpm, variability: moderate,  accelerations:  Present,  decelerations:  Absent UC:   regular, every 3 minutes, pit at 8 munits/ min SVE:   Dilation: 5 Effacement (%): 50 Station: -2 Exam by:: Vanessa Mercer   A / P:  26 y.o.  Obstetric History   G1   P0   T0   P0   A0   TAB0   SAB0   E0   M0   L0    at [redacted]w[redacted]d Induction of labor due to postterm,  progressing well on pitocin polyhydramnios  Fetal Wellbeing:  Category I Pain Control:  Epidural  Anticipated MOD:  NSVD  Vanessa Mercer A. 01/19/2012, 1:37 PM

## 2012-01-19 NOTE — Anesthesia Preprocedure Evaluation (Signed)
Anesthesia Evaluation  Patient identified by MRN, date of birth, ID band Patient awake    Reviewed: Allergy & Precautions, H&P , Patient's Chart, lab work & pertinent test results  Airway Mallampati: II TM Distance: >3 FB Neck ROM: full    Dental  (+) Teeth Intact   Pulmonary  clear to auscultation        Cardiovascular hypertension, Pt. on medications regular Normal    Neuro/Psych    GI/Hepatic   Endo/Other    Renal/GU      Musculoskeletal   Abdominal   Peds  Hematology   Anesthesia Other Findings       Reproductive/Obstetrics (+) Pregnancy                           Anesthesia Physical Anesthesia Plan  ASA: II  Anesthesia Plan: Epidural   Post-op Pain Management:    Induction:   Airway Management Planned:   Additional Equipment:   Intra-op Plan:   Post-operative Plan:   Informed Consent: I have reviewed the patients History and Physical, chart, labs and discussed the procedure including the risks, benefits and alternatives for the proposed anesthesia with the patient or authorized representative who has indicated his/her understanding and acceptance.   Dental Advisory Given  Plan Discussed with:   Anesthesia Plan Comments: (Labs checked- platelets confirmed with RN in room. Fetal heart tracing, per RN, reported to be stable enough for sitting procedure. Discussed epidural, and patient consents to the procedure:  included risk of possible headache,backache, failed block, allergic reaction, and nerve injury. This patient was asked if she had any questions or concerns before the procedure started. )        Anesthesia Quick Evaluation  

## 2012-01-19 NOTE — Progress Notes (Signed)
Patient ID: Vanessa Mercer, female   DOB: [birth year], 26 y.o.   MRN: [id number]  Sent by MD to recheck patient and augment labor with AROM  S: Feeling well      Tolerating contractions - breathing with some ctx / getting uncomfortable     Unsure about analgesia - wants to try IV med prior to epidural   O:  VS: Blood pressure 124/76, pulse 78, temperature 98.2 F (36.8 C), temperature source Oral, height 5\' 4"  (1.626 m), weight 86.183 kg (190 lb), last menstrual period 04/10/2011.        FHR : baseline 125 / variability moderate / accels + / decels none        Toco: contractions every 2-3 minutes / pitocin at 8 mu/min        Cervix : 3 / 75% / vtx / -1 well applied / small BBOW - no cord palpable thru bag of waters        Membranes: AROM with slow leak of AF  / no cord present / vtx well-applied  A: Labor induction for polyhydramnios     FHR category 1  P: Continue current management course with pitocin IOL      IV analgesia ordered prn for pain      Dr Ernestina Penna update - MD to manage intrapartum     Vanessa Mercer 01/19/2012, 10:59 AM

## 2012-01-19 NOTE — Progress Notes (Signed)
S: Pain has been controlled w/ epidural. Pt now noting constant pressure "like I need to have a BM."  O: BP 131/86  Pulse 79  Temp(Src) 98.9 F (37.2 C) (Axillary)  Resp 18  Ht 5\' 4"  (1.626 m)  Wt 86.183 kg (190 lb)  BMI 32.61 kg/m2  SpO2 97%  LMP 04/10/2011   FHT:  FHR: 120 bpm, variability: moderate,  accelerations:  Present,  decelerations:  Absent UC:   regular, every 3 minutes, pit at 10 munits/ min SVE:   Dilation: 8 Effacement (%): 70 Station: 0 Exam by:: J Berrong Repeat exam now: bloody show, cvx 9/ 100%/ vtx -1, molding through cvx 0-+1, asynclitic   A / P:  26 y.o.  Obstetric History   G1   P0   T0   P0   A0   TAB0   SAB0   E0   M0   L0    at [redacted]w[redacted]d Induction of labor due to postterm and polydramnios,  progressing well on pitocin  Fetal Wellbeing:  Category I Pain Control:  Epidural  Anticipated MOD:  NSVD  Plan to get better control of pain- epidural top off, possible reassessment then would like to allow time for passive descent.   Vanessa Mercer A. 01/19/2012, 5:23 PM

## 2012-01-19 NOTE — Anesthesia Procedure Notes (Signed)

## 2012-01-20 ENCOUNTER — Encounter (HOSPITAL_COMMUNITY): Payer: Self-pay

## 2012-01-20 DIAGNOSIS — O9902 Anemia complicating childbirth: Secondary | ICD-10-CM | POA: Diagnosis present

## 2012-01-20 HISTORY — DX: Anemia complicating childbirth: O99.02

## 2012-01-20 LAB — CBC
MCH: 27.5 pg (ref 26.0–34.0)
MCHC: 32.9 g/dL (ref 30.0–36.0)
MCV: 83.6 fL (ref 78.0–100.0)
Platelets: 196 10*3/uL (ref 150–400)
RBC: 3.42 MIL/uL — ABNORMAL LOW (ref 3.87–5.11)
RDW: 14 % (ref 11.5–15.5)

## 2012-01-20 MED ORDER — BENZOCAINE-MENTHOL 20-0.5 % EX AERO
INHALATION_SPRAY | CUTANEOUS | Status: AC
  Administered 2012-01-20: 1 via TOPICAL
  Filled 2012-01-20: qty 56

## 2012-01-20 NOTE — Progress Notes (Signed)
PPD 1 SVD  S:  Reports feeling well.             Tolerating po/ No nausea or vomiting             Bleeding is light             Pain controlled with Motrin             Up ad lib / ambulatory  Newborn  Information for the patient's newborn:  Money, Girl Drucilla [[id number]]  female  breast feeding - flat nipples, difficulty latching   O:  A & O x 3 NAD             VS: Blood pressure 127/80, pulse 83, temperature 97.5 F (36.4 C), temperature source Oral, resp. rate 18, height 5\' 4"  (1.626 m), weight 86.183 kg (190 lb), last menstrual period 04/10/2011, SpO2 100.00%, unknown if currently breastfeeding.  LABS:  Basename 01/20/12 0545 01/19/12 0754  WBC 12.8* 9.4  HGB 9.4* 10.7*  HCT 28.6* 32.9*  PLT 196 217    I&O: I/O last 3 completed shifts: In: -  Out: 950 [Urine:450; Blood:500]      Lungs: Clear and unlabored  Heart: regular rate and rhythm / no mumurs  Abdomen: soft, non-tender, non-distended              Fundus: firm, non-tender, U-1  Perineum: repair intact, mild edema  Lochia: small  Extremities: trace edema, no calf pain or tenderness, neg Homans    A/P: PPD # 1 26 y.o., G1P1001    Principal Problem:  *PP care - s/p SVD 2/19 Active Problems:  Maternal anemia, with delivery  Asymptomatic, will start oral Fe at discharge  Doing well - stable status  Routine post partum orders  Lactation support for latch  Joelee Snoke, CNM, MSN 01/20/2012, 9:17 AM

## 2012-01-20 NOTE — Anesthesia Postprocedure Evaluation (Signed)
  Anesthesia Post-op Note  Patient: Vanessa Mercer  Procedure(s) Performed: * No procedures listed *  Patient Location: PACU and Mother/Baby  Anesthesia Type: Epidural  Level of Consciousness: awake, alert  and oriented  Airway and Oxygen Therapy: Patient Spontanous Breathing  Post-op Pain: none  Post-op Assessment: Post-op Vital signs reviewed, Patient's Cardiovascular Status Stable, No headache, No backache, No residual numbness and No residual motor weakness  Post-op Vital Signs: Reviewed and stable  Complications: No apparent anesthesia complications

## 2012-01-21 MED ORDER — IBUPROFEN 600 MG PO TABS: 600.0000 mg | ORAL_TABLET | Freq: Four times a day (QID) | ORAL | Status: AC

## 2012-01-21 MED ORDER — OXYCODONE-ACETAMINOPHEN 5-325 MG PO TABS: 1.0000 | ORAL_TABLET | ORAL | Status: AC | PRN

## 2012-01-21 NOTE — Discharge Summary (Signed)
Obstetric Discharge Summary Reason for Admission: induction of labor Prenatal Procedures: NST and ultrasound Intrapartum Procedures: spontaneous vaginal delivery with mediolateral episiotomy Postpartum Procedures: none Complications-Operative and Postpartum: 2nd degree perineal laceration Hemoglobin  Date Value Range Status  01/20/2012 9.4* 12.0-15.0 (g/dL) Final     HCT  Date Value Range Status  01/20/2012 28.6* 36.0-46.0 (%) Final    Discharge Diagnoses: Term Pregnancy-delivered  Discharge Information: Date: 01/21/2012 Activity: pelvic rest Diet: routine Medications: PNV, Ibuprofen, Iron and Percocet Condition: stable Instructions: refer to practice specific booklet Discharge to: home Follow-up Information    Follow up with [facility] A., MD. Schedule an appointment as soon as possible for a visit in 6 weeks.   Contact information:   [address] [place] [address] [phone]          Newborn Data: Live born female  Birth Weight: 7 lb 4 oz (3289 g) APGAR: 5, 9  Home with mother.  Vanessa Mercer 01/21/2012, 10:15 AM

## 2012-01-21 NOTE — Progress Notes (Signed)
Patient ID: Redell Dorris Carnes Money, female   DOB: [birth year], 26 y.o.   MRN: [id number]  PPD 2 SVD  S:  Reports feeling well             Tolerating po/ No nausea or vomiting             Bleeding is moderate             Pain controlled withmotrin and percocet             Up ad lib / ambulatory  Newborn breast feeding  / female newborn   O:  A & O x 3 NAD             VS: Blood pressure 108/75, pulse 83, temperature 97.2 F (36.2 C), temperature source Oral, resp. rate 18, height 5\' 4"  (1.626 m), weight 86.183 kg (190 lb), last menstrual period 04/10/2011, SpO2 100.00%, unknown if currently breastfeeding.  Lungs: Clear and unlabored  Heart: regular rate and rhythm   Abdomen: soft, non-tender, non-distended              Fundus: firm, non-tender, U-1  Perineum: mild edema  Lochia: light  Extremities: no edema, no calf pain or tenderness    A: PPD # 2   Doing well - stable status  P:  Routine post partum orders  Discharge to home             WOB booklet - instructions reviewed  Marveline Profeta 01/21/2012, 10:11 AM

## 2012-01-23 ENCOUNTER — Inpatient Hospital Stay (HOSPITAL_COMMUNITY)
Admission: AD | Admit: 2012-01-23 | Discharge: 2012-01-23 | Disposition: A | Discharge status: Home / Self Care | Payer: Managed Care, Other (non HMO) | Source: Ambulatory Visit | Attending: Obstetrics and Gynecology | Admitting: Obstetrics and Gynecology

## 2012-01-23 DIAGNOSIS — O901 Disruption of perineal obstetric wound: Secondary | ICD-10-CM | POA: Insufficient documentation

## 2012-01-23 MED ORDER — LIDOCAINE HCL (PF) 1 % IJ SOLN
30.0000 mL | Freq: Once | INTRAMUSCULAR | Status: AC
  Administered 2012-01-23: 30 mL
  Filled 2012-01-23: qty 30

## 2012-01-23 NOTE — Progress Notes (Signed)
MAU note  S: pain and bleeding from episiotomy repair site      Moderate amount of bleeding this afternoon after BM       HX SVD with right mediolateral episiotomy with 2nd degree LAC on 2/19        O: VS: 97.6 - 91 - 18 - 131/81 Perineum - no edema or erythema around repair site  Small area open in middle of repair site - no evidence of infection  1% xylocaine local to site 4-0 vicryl subcuticular to re-approximate edges of repair  Tolerated well   A/P:  Disruption of episiotomy repair Repaired  Discharge to home

## 2012-01-23 NOTE — Discharge Instructions (Signed)
Tub soaks 1-3 times per day Motrin and percocet as needed for pain

## 2012-01-23 NOTE — Progress Notes (Signed)
Repair of open episiotomy by Wiliam Ke, CNM

## 2012-03-25 LAB — HM PAP SMEAR: HM Pap smear: NEGATIVE

## 2012-11-24 ENCOUNTER — Encounter: Payer: Self-pay | Admitting: Family Medicine

## 2012-11-24 ENCOUNTER — Ambulatory Visit (INDEPENDENT_AMBULATORY_CARE_PROVIDER_SITE_OTHER): Payer: Managed Care, Other (non HMO) | Admitting: Family Medicine

## 2012-11-24 VITALS — BP 120/80 | HR 72 | Temp 98.7°F | Resp 12 | Ht 63.25 in | Wt 191.0 lb

## 2012-11-24 DIAGNOSIS — Z8659 Personal history of other mental and behavioral disorders: Secondary | ICD-10-CM

## 2012-11-24 DIAGNOSIS — G43909 Migraine, unspecified, not intractable, without status migrainosus: Secondary | ICD-10-CM | POA: Insufficient documentation

## 2012-11-24 NOTE — Patient Instructions (Addendum)
Titrate Topamax 25 mg to one in the morning and 2 at night for two weeks then further titrate to 2 twice daily Touch base/schedule followup to discuss further options if migraines not controlled with increased dosage of Topamax

## 2012-11-24 NOTE — Progress Notes (Signed)
  Subjective:    Patient ID: Vanessa Mercer, female    DOB: 09/19/1986, 26 y.o.   MRN: [id number]  HPI  New patient to establish care. Past medical history significant for recurrent depression, history of migraine headaches, and kidney stones x2. She thinks these were calcium stones. Previous left leg surgery for osteochondroma excision.  Patient is followed regularly by GYN. She had history of frequent migraines up to 2-3 times per week. Recently went back on Topamax 25 mg twice a day is still having about one migraine every 10 days. Overall has improved. She recalls at one point having side effects of Topamax including tremor and stuttering but she's not sure if they titrated her dosage slowly. Her triggers are foods including peanuts which she tries to avoid.  Patient history depression which is currently stable on Wellbutrin XL 300 mg daily.  Nonsmoker. No regular alcohol use. She is married. She has one child.  Past Medical History  Diagnosis Date  . Headache   . PCOS (polycystic ovarian syndrome)   . Depression   . Kidney stones   . Other nonspecific findings on examination of blood     elevated serum creatinine  . History of chicken pox   . Hypertension   . PP care - s/p SVD 2/19 01/20/2012  . Maternal anemia, with delivery 01/20/2012   Past Surgical History  Procedure Date  . Wisdom tooth extraction   . Knee surgery     reports that she quit smoking about 18 months ago. Her smoking use included Cigarettes. She has a 1 pack-year smoking history. She has never used smokeless tobacco. She reports that she does not drink alcohol or use illicit drugs. family history includes Arthritis in her sister; Cancer in her paternal grandfather; Diabetes in her maternal grandfather and paternal grandfather; Heart disease in her maternal grandfather and maternal grandmother; Migraines in her mother and sister; and Urolithiasis in her mother. No Known Allergies   Review of Systems    Constitutional: Negative for fever, activity change, appetite change, fatigue and unexpected weight change.  HENT: Negative for hearing loss, ear pain, sore throat and trouble swallowing.   Eyes: Negative for visual disturbance.  Respiratory: Negative for cough and shortness of breath.   Cardiovascular: Negative for chest pain and palpitations.  Gastrointestinal: Negative for abdominal pain, diarrhea, constipation and blood in stool.  Genitourinary: Negative for dysuria and hematuria.  Musculoskeletal: Negative for myalgias, back pain and arthralgias.  Skin: Negative for rash.  Neurological: Positive for headaches. Negative for dizziness and syncope.  Hematological: Negative for adenopathy.  Psychiatric/Behavioral: Negative for confusion and dysphoric mood.       Objective:   Physical Exam  Constitutional: She is oriented to person, place, and time. She appears well-developed and well-nourished.  HENT:  Right Ear: External ear normal.  Left Ear: External ear normal.  Neck: Neck supple. No thyromegaly present.  Cardiovascular: Normal rate and regular rhythm.   Pulmonary/Chest: Effort normal and breath sounds normal. No respiratory distress. She has no wheezes. She has no rales.  Musculoskeletal: She exhibits no edema.  Neurological: She is alert and oriented to person, place, and time. No cranial nerve deficit.          Assessment & Plan:  #1 migraine headaches. Titrate Topamax 25 mg to one in the morning and 2 at night for 2 weeks and then consider further titration to 50 mg twice daily #2 recurrent depression. Stable. Continue Wellbutrin

## 2012-12-06 ENCOUNTER — Encounter: Payer: Self-pay | Admitting: Family Medicine

## 2012-12-06 ENCOUNTER — Ambulatory Visit
Admission: RE | Admit: 2012-12-06 | Discharge: 2012-12-06 | Disposition: A | Discharge status: Home / Self Care | Payer: Managed Care, Other (non HMO) | Source: Ambulatory Visit | Attending: Family Medicine | Admitting: Family Medicine

## 2012-12-06 ENCOUNTER — Ambulatory Visit (INDEPENDENT_AMBULATORY_CARE_PROVIDER_SITE_OTHER): Payer: Managed Care, Other (non HMO) | Admitting: Family Medicine

## 2012-12-06 VITALS — BP 110/80 | Temp 98.7°F | Wt 192.0 lb

## 2012-12-06 DIAGNOSIS — M436 Torticollis: Secondary | ICD-10-CM

## 2012-12-06 DIAGNOSIS — R51 Headache: Secondary | ICD-10-CM

## 2012-12-06 MED ORDER — HYDROCODONE-ACETAMINOPHEN 5-325 MG PO TABS: 2.0000 | ORAL_TABLET | ORAL | Status: DC | PRN

## 2012-12-06 MED ORDER — PREDNISONE 10 MG PO TABS: ORAL_TABLET | ORAL | Status: DC

## 2012-12-06 MED ORDER — TOPIRAMATE 25 MG PO TABS: ORAL_TABLET | ORAL | Status: DC

## 2012-12-06 NOTE — Progress Notes (Signed)
Subjective:    Patient ID: Vanessa Mercer, female    DOB: [birth year], 27 y.o.   MRN: [id number]  HPI Patient seen with progressive headache over 7 days. She has long history of migraine headaches and this is different both in terms of duration and also location. Most of her headaches are retro-orbital and this is. left temporal. She's had nausea but only one episode of vomiting. Headache is become more constant. She complains of feeling of stiff neck but does not have any true meningismus. No fever. Occasional blurring left peripheral vision but no diplopia. No focal weakness. No head injury. She's taken at least 8 or 9 Imitrex during the past week without improvement. No alleviating factors. No exacerbating factors.  Past Medical History  Diagnosis Date  . Headache   . PCOS (polycystic ovarian syndrome)   . Depression   . Kidney stones   . Other nonspecific findings on examination of blood     elevated serum creatinine  . History of chicken pox   . Hypertension   . PP care - s/p SVD 2/19 01/20/2012  . Maternal anemia, with delivery 01/20/2012   Past Surgical History  Procedure Date  . Wisdom tooth extraction   . Knee surgery     reports that she quit smoking about 18 months ago. Her smoking use included Cigarettes. She has a 1 pack-year smoking history. She has never used smokeless tobacco. She reports that she does not drink alcohol or use illicit drugs. family history includes Arthritis in her sister; Cancer in her paternal grandfather; Diabetes in her maternal grandfather and paternal grandfather; Heart disease in her maternal grandfather and maternal grandmother; Migraines in her mother and sister; and Urolithiasis in her mother. No Known Allergies    Review of Systems  Constitutional: Positive for fatigue. Negative for fever, chills, activity change and appetite change.  Eyes: Negative for visual disturbance.  Respiratory: Negative for shortness of breath.   Cardiovascular:  Negative for chest pain.  Gastrointestinal: Positive for nausea. Negative for abdominal pain, diarrhea and constipation.  Neurological: Positive for headaches. Negative for dizziness.  Psychiatric/Behavioral: Negative for confusion.       Objective:   Physical Exam  Constitutional: She is oriented to person, place, and time. She appears well-developed and well-nourished.  HENT:  Right Ear: External ear normal.  Left Ear: External ear normal.  Mouth/Throat: Oropharynx is clear and moist.  Eyes: EOM are normal. Pupils are equal, round, and reactive to light.  Neck: Neck supple. No thyromegaly present.  Cardiovascular: Normal rate and regular rhythm.   Pulmonary/Chest: Effort normal and breath sounds normal. No respiratory distress. She has no wheezes. She has no rales.  Lymphadenopathy:    She has no cervical adenopathy.  Neurological: She is alert and oriented to person, place, and time. No cranial nerve deficit.  Skin: No rash noted.  Psychiatric: She has a normal mood and affect. Her behavior is normal. Judgment and thought content normal.          Assessment & Plan:  Atypical headache. She has history of migraines but this is different in terms of duration (7 days), location which is left temporal (usual headaches retro orbital), and associated neck symptoms. She's not had any true neck rigidity. This is not the worst headache of her life but is on a scale of severity typical for previous migraines. This may represent status migraine but with atypical features above obtain CT head without contrast. If CT normal, prednisone taper and hydrocodone  5 mg one to 2 every 4 hours as needed for severe headache. She is in process of increasing her Topamax for prevention

## 2012-12-06 NOTE — Patient Instructions (Addendum)
Follow up immediately for any fever or worsening headache or any new symptoms.

## 2012-12-07 ENCOUNTER — Emergency Department (HOSPITAL_COMMUNITY)
Admission: EM | Admit: 2012-12-07 | Discharge: 2012-12-07 | Disposition: A | Discharge status: Home Health | Payer: Managed Care, Other (non HMO) | Attending: Emergency Medicine | Admitting: Emergency Medicine

## 2012-12-07 ENCOUNTER — Encounter (HOSPITAL_COMMUNITY): Payer: Self-pay | Admitting: Emergency Medicine

## 2012-12-07 DIAGNOSIS — F329 Major depressive disorder, single episode, unspecified: Secondary | ICD-10-CM | POA: Insufficient documentation

## 2012-12-07 DIAGNOSIS — Z87891 Personal history of nicotine dependence: Secondary | ICD-10-CM | POA: Insufficient documentation

## 2012-12-07 DIAGNOSIS — Z87442 Personal history of urinary calculi: Secondary | ICD-10-CM | POA: Insufficient documentation

## 2012-12-07 DIAGNOSIS — E282 Polycystic ovarian syndrome: Secondary | ICD-10-CM | POA: Insufficient documentation

## 2012-12-07 DIAGNOSIS — H53149 Visual discomfort, unspecified: Secondary | ICD-10-CM | POA: Insufficient documentation

## 2012-12-07 DIAGNOSIS — G43909 Migraine, unspecified, not intractable, without status migrainosus: Secondary | ICD-10-CM | POA: Insufficient documentation

## 2012-12-07 DIAGNOSIS — F3289 Other specified depressive episodes: Secondary | ICD-10-CM | POA: Insufficient documentation

## 2012-12-07 DIAGNOSIS — I10 Essential (primary) hypertension: Secondary | ICD-10-CM | POA: Insufficient documentation

## 2012-12-07 DIAGNOSIS — Z8742 Personal history of other diseases of the female genital tract: Secondary | ICD-10-CM | POA: Insufficient documentation

## 2012-12-07 DIAGNOSIS — Z79899 Other long term (current) drug therapy: Secondary | ICD-10-CM | POA: Insufficient documentation

## 2012-12-07 DIAGNOSIS — R110 Nausea: Secondary | ICD-10-CM | POA: Insufficient documentation

## 2012-12-07 LAB — BASIC METABOLIC PANEL
Calcium: 8.9 mg/dL (ref 8.4–10.5)
Chloride: 106 mEq/L (ref 96–112)
Creatinine, Ser: 0.79 mg/dL (ref 0.50–1.10)
GFR calc Af Amer: >90 mL/min (ref >90)
GFR calc non Af Amer: >90 mL/min (ref >90)

## 2012-12-07 LAB — CBC WITH DIFFERENTIAL/PLATELET
Basophils Absolute: 0 10*3/uL (ref 0.0–0.1)
Basophils Relative: 0 % (ref 0–1)
Eosinophils Absolute: 0 10*3/uL (ref 0.0–0.7)
HCT: 39.5 % (ref 36.0–46.0)
MCH: 28.1 pg (ref 26.0–34.0)
MCHC: 34.2 g/dL (ref 30.0–36.0)
Monocytes Absolute: 0.5 10*3/uL (ref 0.1–1.0)
Neutro Abs: 9.4 10*3/uL — ABNORMAL HIGH (ref 1.7–7.7)
RDW: 13.7 % (ref 11.5–15.5)

## 2012-12-07 MED ORDER — METOCLOPRAMIDE HCL 5 MG/ML IJ SOLN
10.0000 mg | Freq: Once | INTRAMUSCULAR | Status: AC
  Administered 2012-12-07: 10 mg via INTRAVENOUS
  Filled 2012-12-07: qty 2

## 2012-12-07 MED ORDER — ONDANSETRON 4 MG PO TBDP: 4.0000 mg | ORAL_TABLET | Freq: Three times a day (TID) | ORAL | Status: DC | PRN

## 2012-12-07 MED ORDER — OXYCODONE-ACETAMINOPHEN 5-325 MG PO TABS: 2.0000 | ORAL_TABLET | ORAL | Status: DC | PRN

## 2012-12-07 MED ORDER — KETOROLAC TROMETHAMINE 30 MG/ML IJ SOLN
30.0000 mg | Freq: Once | INTRAMUSCULAR | Status: AC
Start: 2012-12-07 — End: 2012-12-07
  Administered 2012-12-07: 30 mg via INTRAVENOUS
  Filled 2012-12-07: qty 1

## 2012-12-07 MED ORDER — HYDROMORPHONE HCL PF 1 MG/ML IJ SOLN
1.0000 mg | Freq: Once | INTRAMUSCULAR | Status: AC
  Administered 2012-12-07: 1 mg via INTRAVENOUS
  Filled 2012-12-07: qty 1

## 2012-12-07 MED ORDER — DIPHENHYDRAMINE HCL 50 MG/ML IJ SOLN
50.0000 mg | Freq: Once | INTRAMUSCULAR | Status: AC
  Administered 2012-12-07: 50 mg via INTRAVENOUS
  Filled 2012-12-07: qty 1

## 2012-12-07 MED ORDER — SODIUM CHLORIDE 0.9 % IV BOLUS (SEPSIS)
1000.0000 mL | Freq: Once | INTRAVENOUS | Status: AC
  Administered 2012-12-07: 1000 mL via INTRAVENOUS

## 2012-12-07 NOTE — ED Notes (Signed)
Pt resting family at bedside.

## 2012-12-07 NOTE — Discharge Instructions (Signed)
Take Percocet as needed for headache. Take Zofran as needed for nausea. Refer to attached documents for more information regarding your diagnosis. Return to the ED with worsening or concerning symptoms.

## 2012-12-07 NOTE — ED Notes (Signed)
Patient complaining of headache/migraine for last eight days; has been taking Imitrex on a regular basis that has provided little to no relief.  Patient was seen by her PCP yesterday; had CT scan done and given steroids.  Patient states that she woke up with an even worse headache this morning.

## 2012-12-07 NOTE — ED Provider Notes (Signed)
History     CSN: [id number]  Arrival date & time 12/07/12  4098   First MD Initiated Contact with Patient 12/07/12 0710      Chief Complaint  Patient presents with  . Headache    (Consider location/radiation/quality/duration/timing/severity/associated sxs/prior treatment) HPI Comments: Patient is a 27 year old female with a past medical history of chronic migraines who presents with a headache for 8 days. Patient reports a gradual onset and progressive worsening of the headache. The pain is sharp, constant and is located in generalized head without radiation. Patient has tried imitrex for symptoms without relief. No alleviating/aggravating factors. Patient reports associated nausea and photophobia. Patient denies fever, vomiting, diarrhea, numbness/tingling, weakness, visual changes, congestion, chest pain, SOB, abdominal pain.     Patient is a 27 y.o. female presenting with headaches.  Headache  Associated symptoms include nausea.    Past Medical History  Diagnosis Date  . Headache   . PCOS (polycystic ovarian syndrome)   . Depression   . Kidney stones   . Other nonspecific findings on examination of blood     elevated serum creatinine  . History of chicken pox   . Hypertension   . PP care - s/p SVD 2/19 01/20/2012  . Maternal anemia, with delivery 01/20/2012    Past Surgical History  Procedure Date  . Wisdom tooth extraction   . Knee surgery     Family History  Problem Relation Age of Onset  . Heart disease Maternal Grandmother   . Diabetes Maternal Grandfather   . Heart disease Maternal Grandfather   . Diabetes Paternal Grandfather   . Cancer Paternal Grandfather     prostate  . Urolithiasis Mother   . Migraines Mother   . Migraines Sister   . Arthritis Sister     psoriatic arthritis    History  Substance Use Topics  . Smoking status: Former Smoker -- 0.5 packs/day for 2 years    Types: Cigarettes    Quit date: 05/11/2011  . Smokeless tobacco: Never  Used  . Alcohol Use: No    OB History    Grav Para Term Preterm Abortions TAB SAB Ect Mult Living   1 1 1  0 0 0 0 0 0 1      Review of Systems  Gastrointestinal: Positive for nausea.  Neurological: Positive for headaches.  All other systems reviewed and are negative.    Allergies  Review of patient's allergies indicates no known allergies.  Home Medications   Current Outpatient Rx  Name  Route  Sig  Dispense  Refill  . BUPROPION HCL ER (XL) 300 MG PO TB24   Oral   Take 300 mg by mouth daily. Per OB/GYN         . BUSPIRONE HCL 15 MG PO TABS   Oral   Take 15 mg by mouth as needed. Pt takes 5 mg as needed per Dr Renae Fickle, OB/GYN         . HYDROCODONE-ACETAMINOPHEN 5-325 MG PO TABS   Oral   Take 2 tablets by mouth every 4 (four) hours as needed for pain.   20 tablet   0   . PREDNISONE 10 MG PO TABS      Taper as follows: 6-6-4-4-4-3-3-2-1   33 tablet   0   . SUMATRIPTAN SUCCINATE 100 MG PO TABS   Oral   Take 100 mg by mouth every 2 (two) hours as needed.         . TOPIRAMATE 25  MG PO TABS      Two tablets twice daily   120 tablet   5     BP 129/75  Pulse 89  Temp 97.9 F (36.6 C) (Oral)  Resp 18  SpO2 100%  Physical Exam  Nursing note and vitals reviewed. Constitutional: She is oriented to person, place, and time. She appears well-developed and well-nourished. No distress.  HENT:  Head: Normocephalic and atraumatic.  Mouth/Throat: Oropharynx is clear and moist. No oropharyngeal exudate.  Eyes: Conjunctivae normal are normal.  Neck: Normal range of motion. Neck supple.  Cardiovascular: Normal rate and regular rhythm.  Exam reveals no gallop and no friction rub.   No murmur heard. Pulmonary/Chest: Effort normal and breath sounds normal. She has no wheezes. She has no rales. She exhibits no tenderness.  Abdominal: Soft. There is no tenderness.  Musculoskeletal: Normal range of motion.  Neurological: She is alert and oriented to person, place, and  time. No cranial nerve deficit. Coordination normal.       Strength and sensation equal and intact bilaterally. Speech is goal-oriented. Moves limbs without ataxia.   Skin: Skin is warm and dry. She is not diaphoretic.  Psychiatric: She has a normal mood and affect. Her behavior is normal.    ED Course  Procedures (including critical care time)  Labs Reviewed  CBC WITH DIFFERENTIAL - Abnormal; Notable for the following:    WBC 11.4 (*)     Neutrophils Relative 83 (*)     Neutro Abs 9.4 (*)     All other components within normal limits  BASIC METABOLIC PANEL - Abnormal; Notable for the following:    Glucose, Bld 113 (*)     All other components within normal limits   Ct Head Wo Contrast  12/06/2012  *RADIOLOGY REPORT*  Clinical Data: 7-day history of progressive headache with nausea and vomiting; neck pain  CT HEAD WITHOUT CONTRAST  Technique:  Contiguous axial images were obtained from the base of the skull through the vertex without contrast.  Comparison: None.  Findings:  Ventricles are normal in size and configuration.  There is no mass, hemorrhage, extra-axial fluid collection, or midline shift.  Gray-white compartments are normal.  Bony calvarium appears intact.  The mastoid air cells are clear.  IMPRESSION: Study within normal limits.   Original Report Authenticated By: Bretta Bang, M.D.      1. Migraine       MDM  7:30 AM Basic labs pending. Patient will have fluids, toradol, reglan, and benadryl.   9:48 AM Labs unremarkbale. Patient will have more reglan, benadryl and dilaudid and be discharged. Patient is afebrile and without meningeal signs. Patient instructed to return with worsening or concerning symptoms. No further evaluation needed at this time.      Emilia Beck, PA-C 12/07/12 1003

## 2012-12-07 NOTE — ED Notes (Signed)
Pt amb to BR without problem, family member at bedside

## 2012-12-07 NOTE — ED Provider Notes (Signed)
Medical screening examination/treatment/procedure(s) were performed by non-physician practitioner and as supervising physician I was immediately available for consultation/collaboration.   Katrinka Herbison B. Bernette Mayers, MD 12/07/12 1008

## 2012-12-07 NOTE — ED Notes (Signed)
Was given pain pills and steriods by her primary MD yesterday after CT scan was negative. Pt states that the medicines are not helping. Pt states pain is sharp in left frontal region and back of neck x 8 days

## 2012-12-07 NOTE — ED Notes (Signed)
meds given for pain.   

## 2012-12-07 NOTE — ED Notes (Signed)
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

## 2012-12-20 ENCOUNTER — Telehealth: Payer: Self-pay | Admitting: Family Medicine

## 2012-12-20 NOTE — Telephone Encounter (Signed)
Pt states that Dr. Caryl Never started her on Topamax for migraines. Recentyl, dosage was increased, and now Rafia she cannot sleep at all, not even with OTC sleep med. Please advise as to diff migraine med maybe?

## 2012-12-22 NOTE — Telephone Encounter (Signed)
Left detailed message on machine for patient 

## 2012-12-22 NOTE — Telephone Encounter (Signed)
Most people develop tolerance to this.  Has she try melatonin or benadryl.  Let's try to give this 1-2 more weeks, if she is willing and if not better by then will make change.

## 2013-01-06 ENCOUNTER — Other Ambulatory Visit: Payer: Self-pay | Admitting: *Deleted

## 2013-01-06 MED ORDER — TOPIRAMATE 25 MG PO TABS: ORAL_TABLET | ORAL | Status: DC

## 2013-01-17 ENCOUNTER — Ambulatory Visit (INDEPENDENT_AMBULATORY_CARE_PROVIDER_SITE_OTHER): Payer: Managed Care, Other (non HMO) | Admitting: Family Medicine

## 2013-01-17 ENCOUNTER — Encounter: Payer: Self-pay | Admitting: Family Medicine

## 2013-01-17 VITALS — BP 100/80 | HR 90 | Temp 98.3°F | Wt 186.0 lb

## 2013-01-17 DIAGNOSIS — S39012A Strain of muscle, fascia and tendon of lower back, initial encounter: Secondary | ICD-10-CM

## 2013-01-17 DIAGNOSIS — S335XXA Sprain of ligaments of lumbar spine, initial encounter: Secondary | ICD-10-CM
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
## 2013-01-17 NOTE — Patient Instructions (Signed)
-  heat for 15 minutes 2 times daily  -topical sports cream that contain capsaicin or menthol are good for pain  -400-600mg  of ibuprofen 2-3 times daily or up to 500-1000mg  2-3 times daily if needed for pain  -Do home exercise program at least 1x daily: **start with circled exercises for the first week **then add the strengthening exercises  Follow up in 1 month

## 2013-04-26 ENCOUNTER — Ambulatory Visit (INDEPENDENT_AMBULATORY_CARE_PROVIDER_SITE_OTHER): Payer: Managed Care, Other (non HMO) | Admitting: Family Medicine

## 2013-04-26 ENCOUNTER — Encounter: Payer: Self-pay | Admitting: Family Medicine

## 2013-04-26 VITALS — BP 120/84 | Temp 98.8°F

## 2013-04-26 DIAGNOSIS — J209 Acute bronchitis, unspecified: Secondary | ICD-10-CM

## 2013-04-26 MED ORDER — HYDROCODONE-HOMATROPINE 5-1.5 MG/5ML PO SYRP: 5.0000 mL | ORAL_SOLUTION | Freq: Four times a day (QID) | ORAL | Status: AC | PRN

## 2013-04-26 NOTE — Progress Notes (Signed)
  Subjective:    Patient ID: Vanessa Mercer, female    DOB: 05-23-1986, 27 y.o.   MRN: [id number]  HPI Acute visit Patient seen with nonproductive cough for 3 days She's had malaise and increased body aches. She's had nasal congestion. Mild sore throat. No significant headache. Sister with similar illness. Patient denies any nausea, vomiting, or diarrhea. Cough especially bothersome at night. She has not taken anything for cough   Review of Systems  Constitutional: Positive for fatigue. Negative for fever and chills.  HENT: Positive for congestion.   Respiratory: Positive for cough. Negative for shortness of breath and wheezing.   Neurological: Negative for headaches.       Objective:   Physical Exam  Constitutional: She appears well-developed and well-nourished.  HENT:  Right Ear: External ear normal.  Left Ear: External ear normal.  Mouth/Throat: Oropharynx is clear and moist.  Neck: Neck supple.  Cardiovascular: Normal rate and regular rhythm.   Pulmonary/Chest: Effort normal and breath sounds normal. No respiratory distress. She has no wheezes. She has no rales.  Lymphadenopathy:    She has no cervical adenopathy.          Assessment & Plan:  Acute bronchitis. Suspect viral. Hycodan cough syrup for as needed cough at night. Reassurance. Followup promptly for fever or change of symptoms

## 2013-04-26 NOTE — Patient Instructions (Addendum)

## 2013-04-28 ENCOUNTER — Telehealth: Payer: Self-pay | Admitting: Family Medicine

## 2013-04-28 MED ORDER — AZITHROMYCIN 250 MG PO TABS: ORAL_TABLET | ORAL | Status: DC

## 2013-04-28 NOTE — Telephone Encounter (Signed)
Zithromax (Z-pack 5 day course)

## 2013-04-28 NOTE — Telephone Encounter (Signed)
Pt is calling to request antibiotics.  Pt was seen at the office on 04/26/13 for Bronchitis.  Pt states she still feels awful (congestion and cough) and would like to have antibiotics.  Pt is afebrile.  Pt is requesting RX to be sent to [facility] off [address]  OFFICE PLEASE FOLLOW UP WITH PT.

## 2013-06-02 ENCOUNTER — Encounter (HOSPITAL_COMMUNITY): Payer: Self-pay | Admitting: Emergency Medicine

## 2013-06-02 ENCOUNTER — Emergency Department (HOSPITAL_COMMUNITY): Payer: Managed Care, Other (non HMO)

## 2013-06-02 ENCOUNTER — Emergency Department (HOSPITAL_COMMUNITY)
Admission: EM | Admit: 2013-06-02 | Discharge: 2013-06-02 | Disposition: A | Discharge status: Home / Self Care | Payer: Managed Care, Other (non HMO) | Attending: Emergency Medicine | Admitting: Emergency Medicine

## 2013-06-02 DIAGNOSIS — Z862 Personal history of diseases of the blood and blood-forming organs and certain disorders involving the immune mechanism: Secondary | ICD-10-CM | POA: Insufficient documentation

## 2013-06-02 DIAGNOSIS — Z87442 Personal history of urinary calculi: Secondary | ICD-10-CM | POA: Insufficient documentation

## 2013-06-02 DIAGNOSIS — Z8619 Personal history of other infectious and parasitic diseases: Secondary | ICD-10-CM | POA: Insufficient documentation

## 2013-06-02 DIAGNOSIS — Z79899 Other long term (current) drug therapy: Secondary | ICD-10-CM | POA: Insufficient documentation

## 2013-06-02 DIAGNOSIS — Z8639 Personal history of other endocrine, nutritional and metabolic disease: Secondary | ICD-10-CM | POA: Insufficient documentation

## 2013-06-02 DIAGNOSIS — J3489 Other specified disorders of nose and nasal sinuses: Secondary | ICD-10-CM | POA: Insufficient documentation

## 2013-06-02 DIAGNOSIS — J40 Bronchitis, not specified as acute or chronic: Secondary | ICD-10-CM | POA: Insufficient documentation

## 2013-06-02 DIAGNOSIS — Z87891 Personal history of nicotine dependence: Secondary | ICD-10-CM | POA: Insufficient documentation

## 2013-06-02 DIAGNOSIS — F329 Major depressive disorder, single episode, unspecified: Secondary | ICD-10-CM | POA: Insufficient documentation

## 2013-06-02 DIAGNOSIS — R079 Chest pain, unspecified: Secondary | ICD-10-CM | POA: Insufficient documentation

## 2013-06-02 DIAGNOSIS — F3289 Other specified depressive episodes: Secondary | ICD-10-CM | POA: Insufficient documentation

## 2013-06-02 DIAGNOSIS — I10 Essential (primary) hypertension: Secondary | ICD-10-CM | POA: Insufficient documentation

## 2013-06-02 MED ORDER — PREDNISONE 20 MG PO TABS
60.0000 mg | ORAL_TABLET | Freq: Once | ORAL | Status: AC
Start: 2013-06-02 — End: 2013-06-02
  Administered 2013-06-02: 60 mg via ORAL
  Filled 2013-06-02: qty 3

## 2013-06-02 MED ORDER — ALBUTEROL SULFATE (5 MG/ML) 0.5% IN NEBU
5.0000 mg | INHALATION_SOLUTION | Freq: Once | RESPIRATORY_TRACT | Status: AC
  Administered 2013-06-02: 5 mg via RESPIRATORY_TRACT
  Filled 2013-06-02 (×2): qty 1

## 2013-06-02 MED ORDER — PREDNISONE 20 MG PO TABS: 60.0000 mg | ORAL_TABLET | Freq: Once | ORAL | Status: DC

## 2013-06-02 NOTE — ED Notes (Signed)
Pt reports she was at work and developed severe cough. States since then has had persisent, strong, dry cough, substernal sharp chest pain and SOB. Pt also reports she was diagnosed with bronchitis several weeks ago, has been using inhaler this AM with no relief of symptoms. Pt alert, oriented x4, lungs clear.

## 2013-06-02 NOTE — ED Provider Notes (Signed)
History    CSN: [id number] Arrival date & time 06/02/13  1036  First MD Initiated Contact with Patient 06/02/13 1110     Chief Complaint  Patient presents with  . Shortness of Breath   (Consider location/radiation/quality/duration/timing/severity/associated sxs/prior Treatment) Patient is a 27 y.o. female presenting with shortness of breath. The history is provided by the patient.  Shortness of Breath Severity:  Moderate Onset quality:  Gradual Associated symptoms: chest pain   Associated symptoms: no abdominal pain, no fever and no headaches   Associated symptoms comment:  Cough with associated SOB and a sense of wheezing. She reports diagnosis of bronchitis several weeks ago and treated with antibiotics. She reports her symptoms never completely resolved but have worsened in the last several days. No fever. Cough that is productive and worse in the mornings. She complains of chest tightness.   Past Medical History  Diagnosis Date  . Headache(784.0)   . PCOS (polycystic ovarian syndrome)   . Depression   . Kidney stones   . Other nonspecific findings on examination of blood(790.99)     elevated serum creatinine  . History of chicken pox   . Hypertension   . PP care - s/p SVD 2/19 01/20/2012  . Maternal anemia, with delivery 01/20/2012   Past Surgical History  Procedure Laterality Date  . Wisdom tooth extraction    . Knee surgery     Family History  Problem Relation Age of Onset  . Heart disease Maternal Grandmother   . Diabetes Maternal Grandfather   . Heart disease Maternal Grandfather   . Diabetes Paternal Grandfather   . Cancer Paternal Grandfather     prostate  . Urolithiasis Mother   . Migraines Mother   . Migraines Sister   . Arthritis Sister     psoriatic arthritis   History  Substance Use Topics  . Smoking status: Former Smoker -- 0.50 packs/day for 2 years    Types: Cigarettes    Quit date: 05/11/2011  . Smokeless tobacco: Never Used  . Alcohol Use:  No   OB History   Grav Para Term Preterm Abortions TAB SAB Ect Mult Living   1 1 1  0 0 0 0 0 0 1     Review of Systems  Constitutional: Negative for fever and chills.  HENT: Positive for congestion.   Respiratory: Positive for shortness of breath.   Cardiovascular: Positive for chest pain.  Gastrointestinal: Negative for nausea and abdominal pain.  Neurological: Negative for headaches.    Allergies  Review of patient's allergies indicates no known allergies.  Home Medications   Current Outpatient Rx  Name  Route  Sig  Dispense  Refill  . busPIRone (BUSPAR) 15 MG tablet   Oral   Take 15 mg by mouth as needed. Pt takes 5 mg as needed per Dr Renae Fickle, OB/GYN         . lurasidone (LATUDA) 40 MG TABS   Oral   Take 40 mg by mouth daily with breakfast.         . Prenatal Vit-Fe Fumarate-FA (PRENATAL MULTIVITAMIN) TABS   Oral   Take 1 tablet by mouth daily at 12 noon.         . Promethazine HCl (PHENERGAN PO)   Oral   Take 1 tablet by mouth once.         . sertraline (ZOLOFT) 50 MG tablet   Oral   Take 50 mg by mouth daily.         Marland Kitchen  SUMAtriptan (IMITREX) 100 MG tablet   Oral   Take 100 mg by mouth every 2 (two) hours as needed.         . topiramate (TOPAMAX) 100 MG tablet   Oral   Take 100 mg by mouth 2 (two) times daily.          BP 105/66  Pulse 76  Temp(Src) 98.6 F (37 C) (Oral)  Resp 20  SpO2 99%  LMP 05/28/2013 Physical Exam  Constitutional: She is oriented to person, place, and time. She appears well-developed and well-nourished.  HENT:  Head: Normocephalic.  Mouth/Throat: Mucous membranes are normal. Posterior oropharyngeal erythema present. No posterior oropharyngeal edema.  Neck: Normal range of motion. Neck supple.  Cardiovascular: Normal rate and regular rhythm.   Pulmonary/Chest: Effort normal and breath sounds normal.  Abdominal: Soft. Bowel sounds are normal. There is no tenderness. There is no rebound and no guarding.   Musculoskeletal: Normal range of motion.  Neurological: She is alert and oriented to person, place, and time.  Skin: Skin is warm and dry. No rash noted.  Psychiatric: She has a normal mood and affect.    ED Course  Procedures (including critical care time) Labs Reviewed - No data to display Dg Chest 2 View  06/02/2013   *RADIOLOGY REPORT*  Clinical Data: Cough, shortness of breath  CHEST - 2 VIEW  Comparison: 09/29/2009  Findings: Lungs are clear. No pleural effusion or pneumothorax.  Cardiomediastinal silhouette is within normal limits.  Visualized osseous structures are within normal limits.  IMPRESSION: No evidence of acute cardiopulmonary disease.   Original Report Authenticated By: Charline Bills, M.D.   No diagnosis found. 1. Bronchitis  MDM  Feels improved with nebulizer treatment. CXR without infection. Will start on Prednisone and recommend supportive care and PCP follow up.  Arnoldo Hooker, PA-C 06/02/13 1432  Arnoldo Hooker, PA-C 06/02/13 1432

## 2013-06-02 NOTE — ED Notes (Signed)
Pt returned from radiology.

## 2013-06-02 NOTE — ED Provider Notes (Signed)
Medical screening examination/treatment/procedure(s) were performed by non-physician practitioner and as supervising physician I was immediately available for consultation/collaboration.   Shelda Jakes, MD 06/02/13 1434

## 2014-10-01 ENCOUNTER — Encounter (HOSPITAL_COMMUNITY): Payer: Self-pay | Admitting: Emergency Medicine

## 2014-11-30 NOTE — L&D Delivery Note (Signed)
Delivery Note  First Stage: Labor onset: 1000 Augmentation: AROM, Pitocin Analgesia /Anesthesia intrapartum: epidural AROM at 1201  Second Stage: Complete dilation at 1616 Onset of pushing at 1620 FHR second stage 125, then 90-110s x5 min  In maternal lithotomy, delivery of a viable female at 42 by CNM in OA position with restitution to LOT Tight nuchal cord x1, reduced Infant to maternal chest, not vigorous after approx 30-45 sec. of stimulation, Cord double clamped, cut by CNM, infant to warmer, code apgar called Cord blood sample collected   Collection of cord blood donation n/a Arterial cord blood sample collected  Third Stage: Placenta delivered via Tomasa Blase intact with 3 VC @ 1629 Placenta disposition: routine disposal Uterine tone firm / bleeding small  1st degree perineal laceration identified  Anesthesia for repair: epidural Repaired with 3-0 Vicryl rapide Est. Blood Loss (mL): 150  Complications: fetal bradycardia, code apgar  Mom to postpartum.  Baby to Couplet care / Skin to Skin.  Newborn: Birth Weight: pending Apgar Scores: 3/7/8 Feeding planned: breast  Donette Larry, N MSN, CNM 08/31/2015, 4:57 PM

## 2015-01-17 ENCOUNTER — Inpatient Hospital Stay (HOSPITAL_COMMUNITY)
Admission: AD | Admit: 2015-01-17 | Discharge: 2015-01-17 | Disposition: A | Discharge status: Home / Self Care | Payer: Managed Care, Other (non HMO) | Source: Ambulatory Visit | Attending: Obstetrics and Gynecology | Admitting: Obstetrics and Gynecology

## 2015-01-17 ENCOUNTER — Inpatient Hospital Stay (HOSPITAL_COMMUNITY): Payer: Managed Care, Other (non HMO)

## 2015-01-17 ENCOUNTER — Encounter (HOSPITAL_COMMUNITY): Payer: Self-pay | Admitting: Obstetrics and Gynecology

## 2015-01-17 DIAGNOSIS — O200 Threatened abortion: Secondary | ICD-10-CM | POA: Insufficient documentation

## 2015-01-17 DIAGNOSIS — Z87442 Personal history of urinary calculi: Secondary | ICD-10-CM | POA: Insufficient documentation

## 2015-01-17 DIAGNOSIS — Z87891 Personal history of nicotine dependence: Secondary | ICD-10-CM | POA: Insufficient documentation

## 2015-01-17 DIAGNOSIS — O26851 Spotting complicating pregnancy, first trimester: Secondary | ICD-10-CM | POA: Diagnosis present

## 2015-01-17 DIAGNOSIS — O208 Other hemorrhage in early pregnancy: Secondary | ICD-10-CM | POA: Diagnosis present

## 2015-01-17 DIAGNOSIS — O418X10 Other specified disorders of amniotic fluid and membranes, first trimester, not applicable or unspecified: Secondary | ICD-10-CM

## 2015-01-17 DIAGNOSIS — O468X1 Other antepartum hemorrhage, first trimester: Secondary | ICD-10-CM

## 2015-01-17 DIAGNOSIS — Z3A01 Less than 8 weeks gestation of pregnancy: Secondary | ICD-10-CM | POA: Insufficient documentation

## 2015-01-17 DIAGNOSIS — O209 Hemorrhage in early pregnancy, unspecified: Secondary | ICD-10-CM | POA: Diagnosis present

## 2015-01-17 HISTORY — DX: Other specified disorders of amniotic fluid and membranes, first trimester, not applicable or unspecified: O41.8X10

## 2015-01-17 HISTORY — DX: Hemorrhage in early pregnancy, unspecified: O20.9

## 2015-01-17 HISTORY — DX: Other antepartum hemorrhage, first trimester: O46.8X1

## 2015-01-17 LAB — URINE MICROSCOPIC-ADD ON

## 2015-01-17 LAB — URINALYSIS, ROUTINE W REFLEX MICROSCOPIC
BILIRUBIN URINE: NEGATIVE
GLUCOSE, UA: NEGATIVE mg/dL
Ketones, ur: NEGATIVE mg/dL
Leukocytes, UA: NEGATIVE
Nitrite: NEGATIVE
PH: 5.5 (ref 5.0–8.0)
PROTEIN: NEGATIVE mg/dL
SPECIFIC GRAVITY, URINE: 1.02 (ref 1.005–1.030)
Urobilinogen, UA: 0.2 mg/dL (ref 0.0–1.0)

## 2015-01-17 NOTE — Discharge Instructions (Signed)
Human Chorionic Gonadotropin (hCG) °This is a test to confirm and monitor pregnancy or to diagnose trophoblastic disease or germ cell tumors. °As early as 10 days after a missed menstrual period (some methods can detect hCG even earlier, at one week after conception) or if your caregiver thinks that your symptoms suggest ectopic pregnancy, a failing pregnancy, trophoblastic disease, or germ cell tumors. hCG is a protein produced in the placenta of a pregnant woman. A pregnancy test is a specific blood or urine test that can detect hCG and confirm pregnancy. This hormone is able to be detected 10 days after a missed menstrual period, the time period when the fertilized egg is implanted in the woman's uterus. With some methods, hCG can be detected even earlier, at one week after conception.  °During the early weeks of pregnancy, hCG is important in maintaining function of the corpus luteum (the mass of cells that forms from a mature egg). Production of hCG increases steadily during the first trimester (8-10 weeks), peaking around the 10th week after the last menstrual cycle. Levels then fall slowly during the remainder of the pregnancy. hCG is no longer detectable within a few weeks after delivery. hCG is also produced by some germ cell tumors and increased levels are seen in trophoblastic disease. °SAMPLE COLLECTION °hCG is commonly detected in urine. The preferred specimen is a random urine sample collected first thing in the morning. hCG can also be measured in blood drawn from a vein in the arm. °NORMAL FINDINGS °Qualitative: negative in non-pregnant women; positive in pregnancy °Quantitative:  °· Gestation less than 1 week: 5-50 Whole HCG (milli-international units/mL) °· Gestation of 2 weeks: 50-500 Whole HCG (milli-international units/mL) °· Gestation of 3 weeks: 100-10,000 Whole HCG (milli-international units/mL) °· Gestation of 4 weeks: 1,000-30,000 Whole HCG (milli-international units/mL) °· Gestation of 5  weeks 3,500-115,000 Whole HCG (milli-international units/mL) °· Gestation of 6-8 weeks: 12,000-270,000 Whole HCG (milli-international units/mL) °· Gestation of 12 weeks: 15,000-220,000 Whole HCG (milli-international units/mL) °· Males and non-pregnant females: less than 5 Whole HCG (milli-international units/mL) °Beta subunit: depends on the method and test used °Ranges for normal findings may vary among different laboratories and hospitals. You should always check with your doctor after having lab work or other tests done to discuss the meaning of your test results and whether your values are considered within normal limits. °MEANING OF TEST  °Your caregiver will go over the test results with you and discuss the importance and meaning of your results, as well as treatment options and the need for additional tests if necessary. °OBTAINING THE TEST RESULTS °It is your responsibility to obtain your test results. Ask the lab or department performing the test when and how you will get your results. °Document Released: 12/18/2004 Document Revised: 02/08/2012 Document Reviewed: 02/19/2014 °ExitCare® Patient Information ©2015 [employer]. This information is not intended to replace advice given to you by your health care provider. Make sure you discuss any questions you have with your health care provider. ° °Threatened Miscarriage °A threatened miscarriage occurs when you have vaginal bleeding during your first 20 weeks of pregnancy but the pregnancy has not ended. If you have vaginal bleeding during this time, your health care provider will do tests to make sure you are still pregnant. If the tests show you are still pregnant and the developing baby (fetus) inside your womb (uterus) is still growing, your condition is considered a threatened miscarriage. °A threatened miscarriage does not mean your pregnancy will end, but it does increase   the risk of losing your pregnancy (complete miscarriage). °CAUSES  °The cause of  a threatened miscarriage is usually not known. If you go on to have a complete miscarriage, the most common cause is an abnormal number of chromosomes in the developing baby. Chromosomes are the structures inside cells that hold all your genetic material. °Some causes of vaginal bleeding that do not result in miscarriage include: °· Having sex. °· Having an infection. °· Normal hormone changes of pregnancy. °· Bleeding that occurs when an egg implants in your uterus. °RISK FACTORS °Risk factors for bleeding in early pregnancy include: °· Obesity. °· Smoking. °· Drinking excessive amounts of alcohol or caffeine. °· Recreational drug use. °SIGNS AND SYMPTOMS °· Light vaginal bleeding. °· Mild abdominal pain or cramps. °DIAGNOSIS  °If you have bleeding with or without abdominal pain before 20 weeks of pregnancy, your health care provider will do tests to check whether you are still pregnant. One important test involves using sound waves and a computer (ultrasound) to create images of the inside of your uterus. Other tests include an internal exam of your vagina and uterus (pelvic exam) and measurement of your baby's heart rate.  °You may be diagnosed with a threatened miscarriage if: °· Ultrasound testing shows you are still pregnant. °· Your baby's heart rate is strong. °· A pelvic exam shows that the opening between your uterus and your vagina (cervix) is closed. °· Your heart rate and blood pressure are stable. °· Blood tests confirm you are still pregnant. °TREATMENT  °No treatments have been shown to prevent a threatened miscarriage from going on to a complete miscarriage. However, the right home care is important.  °HOME CARE INSTRUCTIONS  °· Make sure you keep all your appointments for prenatal care. This is very important. °· Get plenty of rest. °· Do not have sex or use tampons if you have vaginal bleeding. °· Do not douche. °· Do not smoke or use recreational drugs. °· Do not drink alcohol. °· Avoid  caffeine. °SEEK MEDICAL CARE IF: °· You have light vaginal bleeding or spotting while pregnant. °· You have abdominal pain or cramping. °· You have a fever. °SEEK IMMEDIATE MEDICAL CARE IF: °· You have heavy vaginal bleeding. °· You have blood clots coming from your vagina. °· You have severe low back pain or abdominal cramps. °· You have fever, chills, and severe abdominal pain. °MAKE SURE YOU: °· Understand these instructions. °· Will watch your condition. °· Will get help right away if you are not doing well or get worse. °Document Released: 11/16/2005 Document Revised: 11/21/2013 Document Reviewed: 09/12/2013 °ExitCare® Patient Information ©2015 [employer]. This information is not intended to replace advice given to you by your health care provider. Make sure you discuss any questions you have with your health care provider. ° °Vaginal Bleeding During Pregnancy, First Trimester °A small amount of bleeding (spotting) from the vagina is relatively common in early pregnancy. It usually stops on its own. Various things may cause bleeding or spotting in early pregnancy. Some bleeding may be related to the pregnancy, and some may not. In most cases, the bleeding is normal and is not a problem. However, bleeding can also be a sign of something serious. Be sure to tell your health care provider about any vaginal bleeding right away. °Some possible causes of vaginal bleeding during the first trimester include: °· Infection or inflammation of the cervix. °· Growths (polyps) on the cervix. °· Miscarriage or threatened miscarriage. °· Pregnancy tissue has   developed outside of the uterus and in a fallopian tube (tubal pregnancy). °· Tiny cysts have developed in the uterus instead of pregnancy tissue (molar pregnancy). °HOME CARE INSTRUCTIONS  °Watch your condition for any changes. The following actions may help to lessen any discomfort you are feeling: °· Follow your health care provider's instructions for limiting your  activity. If your health care provider orders bed rest, you may need to stay in bed and only get up to use the bathroom. However, your health care provider may allow you to continue light activity. °· If needed, make plans for someone to help with your regular activities and responsibilities while you are on bed rest. °· Keep track of the number of pads you use each day, how often you change pads, and how soaked (saturated) they are. Write this down. °· Do not use tampons. Do not douche. °· Do not have sexual intercourse or orgasms until approved by your health care provider. °· If you pass any tissue from your vagina, save the tissue so you can show it to your health care provider. °· Only take over-the-counter or prescription medicines as directed by your health care provider. °· Do not take aspirin because it can make you bleed. °· Keep all follow-up appointments as directed by your health care provider. °SEEK MEDICAL CARE IF: °· You have any vaginal bleeding during any part of your pregnancy. °· You have cramps or labor pains. °· You have a fever, not controlled by medicine. °SEEK IMMEDIATE MEDICAL CARE IF:  °· You have severe cramps in your back or belly (abdomen). °· You pass large clots or tissue from your vagina. °· Your bleeding increases. °· You feel light-headed or weak, or you have fainting episodes. °· You have chills. °· You are leaking fluid or have a gush of fluid from your vagina. °· You pass out while having a bowel movement. °MAKE SURE YOU: °· Understand these instructions. °· Will watch your condition. °· Will get help right away if you are not doing well or get worse. °Document Released: 08/26/2005 Document Revised: 11/21/2013 Document Reviewed: 07/24/2013 °ExitCare® Patient Information ©2015 [employer]. This information is not intended to replace advice given to you by your health care provider. Make sure you discuss any questions you have with your health care provider. ° °Pelvic  Rest °Pelvic rest is sometimes recommended for women when:  °· The placenta is partially or completely covering the opening of the cervix (placenta previa). °· There is bleeding between the uterine wall and the amniotic sac in the first trimester (subchorionic hemorrhage). °· The cervix begins to open without labor starting (incompetent cervix, cervical insufficiency). °· The labor is too early (preterm labor). °HOME CARE INSTRUCTIONS °· Do not have sexual intercourse, stimulation, or an orgasm. °· Do not use tampons, douche, or put anything in the vagina. °· Do not lift anything over 10 pounds (4.5 kg). °· Avoid strenuous activity or straining your pelvic muscles. °SEEK MEDICAL CARE IF:  °· You have any vaginal bleeding during pregnancy. Treat this as a potential emergency. °· You have cramping pain felt low in the stomach (stronger than menstrual cramps). °· You notice vaginal discharge (watery, mucus, or bloody). °· You have a low, dull backache. °· There are regular contractions or uterine tightening. °SEEK IMMEDIATE MEDICAL CARE IF: °You have vaginal bleeding and have placenta previa.  °Document Released: 03/13/2011 Document Revised: 02/08/2012 Document Reviewed: 03/13/2011 °ExitCare® Patient Information ©2015 [employer]. This information is not intended to replace advice   given to you by your health care provider. Make sure you discuss any questions you have with your health care provider. ° °

## 2015-01-17 NOTE — MAU Note (Signed)
Pt states she noticed brown bleeding yesterday afternoon. Pt states she was seen in the office. Pt states she got up in the middle of the night to go to the bathroom and noticed bright red blood. Pt states she was told to come in for evaluation if she notice bright red bleeding. Pt was given a prescription for progesterone because her levels were low.

## 2015-01-17 NOTE — MAU Provider Note (Signed)
History     CSN: [id number]  Arrival date and time: 01/17/15 0301  Orders placed in EPIC: 0306 Provider notified: 0421 Provider on unit: [phone] Provider at bedside: 0440    Chief Complaint  Patient presents with  . Vaginal Bleeding   HPI  [name] is a 29 yo G3P1011 at 6.[redacted] wks gestation presenting with complaints of vaginal spotting.  She was seen in the office for this same problem. She had a HCG of 29,500, a progesterone level of 11.2 and an ultrasound with living SIUP. She states she is to begin progesterone supplements soon.  Her primary [facility] provider at [facility] is Marlinda Mike, CNM.  Past Medical History  Diagnosis Date  . Headache(784.0)   . PCOS (polycystic ovarian syndrome)   . Depression   . Kidney stones   . Other nonspecific findings on examination of blood(790.99)     elevated serum creatinine  . History of chicken pox   . Hypertension   . PP care - s/p SVD 2/19 01/20/2012  . Maternal anemia, with delivery 01/20/2012  . Subchorionic hemorrhage in first trimester 01/17/2015  . Bleeding in early pregnancy 01/17/2015    Past Surgical History  Procedure Laterality Date  . Wisdom tooth extraction    . Knee surgery      Family History  Problem Relation Age of Onset  . Heart disease Maternal Grandmother   . Diabetes Maternal Grandfather   . Heart disease Maternal Grandfather   . Diabetes Paternal Grandfather   . Cancer Paternal Grandfather     prostate  . Urolithiasis Mother   . Migraines Mother   . Migraines Sister   . Arthritis Sister     psoriatic arthritis    History  Substance Use Topics  . Smoking status: Former Smoker -- 0.50 packs/day for 2 years    Types: Cigarettes    Quit date: 05/11/2011  . Smokeless tobacco: Never Used  . Alcohol Use: No    Allergies: No Known Allergies  No prescriptions prior to admission    Review of Systems  Constitutional: Negative.   HENT: Negative.   Eyes: Negative.   Respiratory: Negative.    Cardiovascular: Negative.   Gastrointestinal: Negative.   Genitourinary:       Bright, red spotting that was dripping into the toilet  Musculoskeletal: Negative.   Skin: Negative.   Neurological: Negative.   Endo/Heme/Allergies: Negative.   Psychiatric/Behavioral: Negative.    Results for orders placed or performed during the hospital encounter of 01/17/15 (from the past 24 hour(s))  Urinalysis, Routine w reflex microscopic     Status: Abnormal   Collection Time: 01/17/15 12:25 AM  Result Value Ref Range   Color, Urine YELLOW YELLOW   APPearance CLEAR CLEAR   Specific Gravity, Urine 1.020 1.005 - 1.030   pH 5.5 5.0 - 8.0   Glucose, UA NEGATIVE NEGATIVE mg/dL   Hgb urine dipstick MODERATE (A) NEGATIVE   Bilirubin Urine NEGATIVE NEGATIVE   Ketones, ur NEGATIVE NEGATIVE mg/dL   Protein, ur NEGATIVE NEGATIVE mg/dL   Urobilinogen, UA 0.2 0.0 - 1.0 mg/dL   Nitrite NEGATIVE NEGATIVE   Leukocytes, UA NEGATIVE NEGATIVE  Urine microscopic-add on     Status: Abnormal   Collection Time: 01/17/15 12:25 AM  Result Value Ref Range   Squamous Epithelial / LPF RARE RARE   WBC, UA 0-2 <3 WBC/hpf   RBC / HPF 3-6 <3 RBC/hpf   Bacteria, UA FEW (A) RARE   US OB Comp Less  14 Wks  01/17/2015   CLINICAL DATA:  Vaginal bleeding since yesterday.  EXAM: OBSTETRIC <14 WK US AND TRANSVAGINAL OB US  TECHNIQUE: Both transabdominal and transvaginal ultrasound examinations were performed for complete evaluation of the gestation as well as the maternal uterus, adnexal regions, and pelvic cul-de-sac. Transvaginal technique was performed to assess early pregnancy.  COMPARISON:  None.  FINDINGS: Intrauterine gestational sac: Visualized/normal in shape.  Yolk sac:  Visible  Embryo:  Visible  Cardiac Activity: Visi  Heart Rate: Ms. a  bpm  CRL:  9.9  mm   7 w 1 d.  US EDC: 09/04/2015  Maternal uterus/adnexae: Normal  IMPRESSION: Living intrauterine gestation at 7 weeks 1 day by crown-rump length.   Electronically  Signed   By: Ellery Plunkaniel R Mitchell M.D. On: 01/17/2015 04:08    Physical Exam   Blood pressure 120/74, pulse 87, temperature 98.3 F (36.8 C), temperature source Oral, resp. rate 16.  Physical Exam  Constitutional: She is oriented to person, place, and time. She appears well-developed and well-nourished.  HENT:  Head: Normocephalic.  Eyes: Pupils are equal, round, and reactive to light.  Neck: Normal range of motion.  Cardiovascular: Normal rate, regular rhythm and normal heart sounds.   Respiratory: Effort normal and breath sounds normal.  GI: Soft. Bowel sounds are normal.  Genitourinary:  Enlarged uterus; small amount of brown to red blood in cervical os  Musculoskeletal: Normal range of motion.  Neurological: She is alert and oriented to person, place, and time. She has normal reflexes.  Skin: Skin is warm and dry.  Psychiatric: She has a normal mood and affect. Her behavior is normal. Judgment and thought content normal.    MAU Course  Procedures U/S <14 wks Pelvic exam  Assessment and Plan  SIUP @ 6.[redacted] wks gestation Bleeding in early pregnancy Threatened Miscarriage Subchorionic Hemorrhage - previously dx'd  Discharge home  Verbal Threatened miscarriage, vaginal bleeding precautions, pelvic rest reviewed  Instructed to call the office if soaking pad every hour Schedule appointment with Kenney Housemananya in 1 week / call sooner, if needed  Dr. Cherly Hensenousins notified of assessment and plan - agrees  Kenard GowerAWSON, Noreen Mackintosh, M MSN, CNM 01/17/2015, 3:04 PM

## 2015-02-01 LAB — OB RESULTS CONSOLE GC/CHLAMYDIA
Chlamydia: NEGATIVE
Gonorrhea: NEGATIVE

## 2015-02-01 LAB — OB RESULTS CONSOLE RPR: RPR: NONREACTIVE

## 2015-02-01 LAB — OB RESULTS CONSOLE HIV ANTIBODY (ROUTINE TESTING): HIV: NONREACTIVE

## 2015-02-01 LAB — OB RESULTS CONSOLE ABO/RH: RH TYPE: POSITIVE

## 2015-02-01 LAB — OB RESULTS CONSOLE HEPATITIS B SURFACE ANTIGEN: Hepatitis B Surface Ag: NEGATIVE

## 2015-02-01 LAB — OB RESULTS CONSOLE RUBELLA ANTIBODY, IGM: Rubella: NON-IMMUNE/NOT IMMUNE

## 2015-02-01 LAB — OB RESULTS CONSOLE ANTIBODY SCREEN: Antibody Screen: NEGATIVE

## 2015-02-04 ENCOUNTER — Inpatient Hospital Stay (HOSPITAL_COMMUNITY)
Admission: AD | Admit: 2015-02-04 | Payer: Managed Care, Other (non HMO) | Source: Ambulatory Visit | Admitting: Obstetrics and Gynecology

## 2015-06-09 ENCOUNTER — Inpatient Hospital Stay (HOSPITAL_COMMUNITY)
Admission: AD | Admit: 2015-06-09 | Discharge: 2015-06-09 | Disposition: A | Discharge status: Home / Self Care | Payer: Managed Care, Other (non HMO) | Source: Ambulatory Visit | Attending: Obstetrics and Gynecology | Admitting: Obstetrics and Gynecology

## 2015-06-09 ENCOUNTER — Encounter (HOSPITAL_COMMUNITY): Payer: Self-pay | Admitting: *Deleted

## 2015-06-09 DIAGNOSIS — M549 Dorsalgia, unspecified: Secondary | ICD-10-CM | POA: Insufficient documentation

## 2015-06-09 DIAGNOSIS — O9989 Other specified diseases and conditions complicating pregnancy, childbirth and the puerperium: Secondary | ICD-10-CM | POA: Insufficient documentation

## 2015-06-09 DIAGNOSIS — E860 Dehydration: Secondary | ICD-10-CM | POA: Insufficient documentation

## 2015-06-09 DIAGNOSIS — R109 Unspecified abdominal pain: Secondary | ICD-10-CM | POA: Diagnosis present

## 2015-06-09 DIAGNOSIS — O99891 Other specified diseases and conditions complicating pregnancy: Secondary | ICD-10-CM

## 2015-06-09 DIAGNOSIS — Z3A27 27 weeks gestation of pregnancy: Secondary | ICD-10-CM | POA: Insufficient documentation

## 2015-06-09 DIAGNOSIS — Z87891 Personal history of nicotine dependence: Secondary | ICD-10-CM | POA: Diagnosis not present

## 2015-06-09 LAB — URINALYSIS, ROUTINE W REFLEX MICROSCOPIC
BILIRUBIN URINE: NEGATIVE
Glucose, UA: NEGATIVE mg/dL
Hgb urine dipstick: NEGATIVE
KETONES UR: NEGATIVE mg/dL
LEUKOCYTES UA: NEGATIVE
NITRITE: NEGATIVE
Protein, ur: NEGATIVE mg/dL
Specific Gravity, Urine: >1.03 — ABNORMAL HIGH (ref 1.005–1.030)
UROBILINOGEN UA: 0.2 mg/dL (ref 0.0–1.0)
pH: 5.5 (ref 5.0–8.0)

## 2015-06-09 MED ORDER — LACTATED RINGERS IV SOLN
INTRAVENOUS | Status: DC
  Administered 2015-06-09: 17:00:00 via INTRAVENOUS

## 2015-06-09 MED ORDER — NALBUPHINE HCL 10 MG/ML IJ SOLN
10.0000 mg | Freq: Once | INTRAMUSCULAR | Status: AC
  Administered 2015-06-09: 10 mg via INTRAVENOUS
  Filled 2015-06-09: qty 1

## 2015-06-09 NOTE — MAU Note (Signed)
Pt presents to MAU with complaints of back pain for a couple of weeks that has gotten worse today with abdominal cramping. Denies any vaginal bleeding or LOF

## 2015-06-09 NOTE — MAU Provider Note (Signed)
History     CSN: [id number]  Arrival date and time: 06/09/15 1507 Tc from nurse @1546   First Provider Initiated Contact with Patient 06/09/15 1603      Chief Complaint  Patient presents with  . Back Pain  . Abdominal Pain   HPI  backpain x 2 weeks - worse today Hx kidney stones and thinks it could be kidney stone No fever or chills  no urinary symptoms or hematuria  Past Medical History  Diagnosis Date  . Headache(784.0)   . PCOS (polycystic ovarian syndrome)   . Depression   . Kidney stones   . Other nonspecific findings on examination of blood(790.99)     elevated serum creatinine  . History of chicken pox   . Hypertension   . PP care - s/p SVD 2/19 01/20/2012  . Maternal anemia, with delivery 01/20/2012  . Subchorionic hemorrhage in first trimester 01/17/2015  . Bleeding in early pregnancy 01/17/2015    Past Surgical History  Procedure Laterality Date  . Wisdom tooth extraction    . Knee surgery      Family History  Problem Relation Age of Onset  . Heart disease Maternal Grandmother   . Diabetes Maternal Grandfather   . Heart disease Maternal Grandfather   . Diabetes Paternal Grandfather   . Cancer Paternal Grandfather     prostate  . Urolithiasis Mother   . Migraines Mother   . Migraines Sister   . Arthritis Sister     psoriatic arthritis    History  Substance Use Topics  . Smoking status: Former Smoker -- 0.50 packs/day for 2 years    Types: Cigarettes    Quit date: 05/11/2011  . Smokeless tobacco: Never Used  . Alcohol Use: No    Allergies: No Known Allergies  Prescriptions prior to admission  Medication Sig Dispense Refill Last Dose  . busPIRone (BUSPAR) 15 MG tablet Take 15 mg by mouth daily.    06/09/2015 at Unknown time  . cyclobenzaprine (FLEXERIL) 10 MG tablet Take 10 mg by mouth at bedtime.    06/08/2015 at Unknown time  . MAGNESIUM PO Take 1 tablet by mouth daily.   06/09/2015 at Unknown time  . Prenatal Vit-Fe Fumarate-FA (PRENATAL  MULTIVITAMIN) TABS Take 1 tablet by mouth daily at 12 noon.   06/09/2015 at Unknown time  . promethazine (PHENERGAN) 25 MG tablet Take 25 mg by mouth every 6 (six) hours as needed for nausea or vomiting.   prn at prn  . pseudoephedrine (SUDAFED) 60 MG tablet Take 60 mg by mouth every 4 (four) hours as needed for congestion.   06/09/2015 at Unknown time  . pyridOXINE (VITAMIN B-6) 100 MG tablet Take 100 mg by mouth daily.   06/09/2015 at Unknown time  . riboflavin (VITAMIN B-2) 100 MG TABS tablet Take 400 mg by mouth daily.    06/09/2015 at Unknown time  . sertraline (ZOLOFT) 100 MG tablet Take 150 mg by mouth.   06/09/2015 at Unknown time  . SUMAtriptan (IMITREX) 100 MG tablet Take 100 mg by mouth every 2 (two) hours as needed for migraine.    prn at prn    ROS  Back pain No urgency or frequency or hematuria + FM No ctx / mild intermittent cramps No vaginal discharge or bleeding  Physical Exam   Blood pressure 113/75, pulse 89, temperature 98.7 F (37.1 C), resp. rate 16, height 5\' 4"  (1.626 m), weight 83.28 kg (183 lb 9.6 oz).  Physical Exam Alert and oriented /  NAD or pain Abdomen soft and non-tender  Uterus gravid third trimester / non-tender CVA negative bilaterally  FHR 145 (+) accelsResults for Vanessa, Mercer (MRN [id number]) as of 06/09/2015 16:28  Ref. Range 06/09/2015 15:15  Appearance Latest Ref Range: CLEAR  CLEAR  Bilirubin Urine Latest Ref Range: NEGATIVE  NEGATIVE  Color, Urine Latest Ref Range: YELLOW  YELLOW  Glucose Latest Ref Range: NEGATIVE mg/dL NEGATIVE  Hgb urine dipstick Latest Ref Range: NEGATIVE  NEGATIVE  Ketones, ur Latest Ref Range: NEGATIVE mg/dL NEGATIVE  Leukocytes, UA Latest Ref Range: NEGATIVE  NEGATIVE  Nitrite Latest Ref Range: NEGATIVE  NEGATIVE  pH Latest Ref Range: 5.0-8.0  5.5  Protein Latest Ref Range: NEGATIVE mg/dL NEGATIVE  Specific Gravity, Urine Latest Ref Range: 1.005-1.030  >1.030 (H)  Urobilinogen, UA Latest Ref Range: 0.0-1.0 mg/dL 0.2      MAU Course  Procedures  NST - reactive  IV hydration - strain urine  Assessment and Plan  27 weeks back pain Mild dehydration  IV hydration x 2 liters Nubain for pain Strain urine  Re-evaluate in 2-3 hours for DC versus admit  Marlinda Mike 06/09/2015, 4:17 PM   Recheck @ 1900 - feeling much better / IVF and pain medication effective DC home - need to maintain good water intake Keep ROB visit this week as scheduled

## 2015-06-09 NOTE — MAU Note (Signed)
Urine in lab 

## 2015-06-09 NOTE — MAU Note (Signed)
Assumed care of patient.

## 2015-06-09 NOTE — MAU Note (Signed)
Patient assisted to the bathroom; urine strained for kidney stones.

## 2015-06-24 ENCOUNTER — Other Ambulatory Visit: Payer: Self-pay | Admitting: Obstetrics & Gynecology

## 2015-06-24 DIAGNOSIS — R319 Hematuria, unspecified: Secondary | ICD-10-CM

## 2015-06-24 DIAGNOSIS — M545 Low back pain, unspecified: Secondary | ICD-10-CM

## 2015-06-28 ENCOUNTER — Ambulatory Visit
Admission: RE | Admit: 2015-06-28 | Discharge: 2015-06-28 | Disposition: A | Discharge status: Home / Self Care | Payer: Managed Care, Other (non HMO) | Source: Ambulatory Visit | Attending: Obstetrics & Gynecology | Admitting: Obstetrics & Gynecology

## 2015-06-28 DIAGNOSIS — M545 Low back pain, unspecified: Secondary | ICD-10-CM

## 2015-06-28 DIAGNOSIS — R319 Hematuria, unspecified: Secondary | ICD-10-CM

## 2015-07-23 ENCOUNTER — Ambulatory Visit (INDEPENDENT_AMBULATORY_CARE_PROVIDER_SITE_OTHER): Payer: Managed Care, Other (non HMO) | Admitting: Family Medicine

## 2015-07-23 ENCOUNTER — Encounter: Payer: Self-pay | Admitting: Family Medicine

## 2015-07-23 ENCOUNTER — Encounter: Payer: Self-pay | Admitting: *Deleted

## 2015-07-23 VITALS — BP 120/80 | HR 103 | Temp 99.0°F | Ht 64.0 in | Wt 189.2 lb

## 2015-07-23 DIAGNOSIS — J40 Bronchitis, not specified as acute or chronic: Secondary | ICD-10-CM | POA: Diagnosis not present

## 2015-07-23 MED ORDER — AZITHROMYCIN 250 MG PO TABS: ORAL_TABLET | ORAL | Status: DC

## 2015-07-23 MED ORDER — FLUTICASONE PROPIONATE HFA 44 MCG/ACT IN AERO: 2.0000 | INHALATION_SPRAY | Freq: Two times a day (BID) | RESPIRATORY_TRACT | Status: DC

## 2015-07-23 NOTE — Progress Notes (Signed)
Pre visit review using our clinic review tool, if applicable. No additional management support is needed unless otherwise documented below in the visit note. 

## 2015-07-23 NOTE — Patient Instructions (Addendum)
Before you leave - work note for today  Please contact your obstetrician to notify them you are doing a short course of inhaled steroid (flovent) for the bronchitis  Nasal saline twice daily  Cough drops  Tea and fluids and rest  Take the antibiotic only if not improving if ok with your obstetrician

## 2015-07-23 NOTE — Progress Notes (Signed)
HPI:  Cough and congestion: -[redacted] weeks pregnant, reports healthy pregnancy, denies HA, LOF, vag bleeding, contractions -symptoms started about 1 week ago -symptoms:nasal congestion, sore throat, cough, PND -denies:fever, SOB, NVD, tooth pain, wheezing -has tried: nothing -sick contacts/travel/risks: denies flu exposure, tick exposure or or Ebola risks -Hx of: the same and reports her colds turn into bronchitis and has to do inhaled steroids and zpack for this usually -sees ob in less then 1 week for follow up  ROS: See pertinent positives and negatives per HPI.  Past Medical History  Diagnosis Date  . Headache(784.0)   . PCOS (polycystic ovarian syndrome)   . Depression   . Kidney stones   . Other nonspecific findings on examination of blood(790.99)     elevated serum creatinine  . History of chicken pox   . Hypertension   . PP care - s/p SVD 2/19 01/20/2012  . Maternal anemia, with delivery 01/20/2012  . Subchorionic hemorrhage in first trimester 01/17/2015  . Bleeding in early pregnancy 01/17/2015    Past Surgical History  Procedure Laterality Date  . Wisdom tooth extraction    . Knee surgery      Family History  Problem Relation Age of Onset  . Heart disease Maternal Grandmother   . Diabetes Maternal Grandfather   . Heart disease Maternal Grandfather   . Diabetes Paternal Grandfather   . Cancer Paternal Grandfather     prostate  . Urolithiasis Mother   . Migraines Mother   . Migraines Sister   . Arthritis Sister     psoriatic arthritis    Social History   Social History  . Marital Status: Married    Spouse Name: N/A  . Number of Children: N/A  . Years of Education: N/A   Social History Main Topics  . Smoking status: Former Smoker -- 0.50 packs/day for 2 years    Types: Cigarettes    Quit date: 05/11/2011  . Smokeless tobacco: Never Used  . Alcohol Use: No  . Drug Use: No  . Sexual Activity: Yes    Birth Control/ Protection: None     Comment:  perigard   Other Topics Concern  . None   Social History Narrative     Current outpatient prescriptions:  .  busPIRone (BUSPAR) 15 MG tablet, Take 15 mg by mouth daily. , Disp: , Rfl:  .  cyclobenzaprine (FLEXERIL) 10 MG tablet, Take 10 mg by mouth at bedtime. , Disp: , Rfl:  .  MAGNESIUM PO, Take 1 tablet by mouth daily., Disp: , Rfl:  .  Prenatal Vit-Fe Fumarate-FA (PRENATAL MULTIVITAMIN) TABS, Take 1 tablet by mouth daily at 12 noon., Disp: , Rfl:  .  pyridOXINE (VITAMIN B-6) 100 MG tablet, Take 100 mg by mouth daily., Disp: , Rfl:  .  riboflavin (VITAMIN B-2) 100 MG TABS tablet, Take 400 mg by mouth daily. , Disp: , Rfl:  .  sertraline (ZOLOFT) 100 MG tablet, Take 150 mg by mouth., Disp: , Rfl:  .  azithromycin (ZITHROMAX) 250 MG tablet, 2 tabs on the first day, then 1 tab daily, Disp: 6 tablet, Rfl: 0 .  fluticasone (FLOVENT HFA) 44 MCG/ACT inhaler, Inhale 2 puffs into the lungs 2 (two) times daily. For 5-7 days, Disp: 1 Inhaler, Rfl: 0  EXAM:  Filed Vitals:   07/23/15 1550  BP: 120/80  Pulse: 103  Temp: 99 F (37.2 C)    Body mass index is 32.46 kg/(m^2).  GENERAL: vitals reviewed and listed above, alert,  oriented, appears well hydrated and in no acute distress  HEENT: atraumatic, conjunttiva clear, no obvious abnormalities on inspection of external nose and ears, normal appearance of ear canals and TMs, clear nasal congestion, mild post oropharyngeal erythema with PND, no tonsillar edema or exudate, no sinus TTP  NECK: no obvious masses on inspection  LUNGS: clear to auscultation bilaterally, no wheezes, rales or rhonchi, good air movement  CV: HRRR, no peripheral edema  MS: moves all extremities without noticeable abnormality  PSYCH: pleasant and cooperative, no obvious depression or anxiety  ASSESSMENT AND PLAN:  Discussed the following assessment and plan:  Bronchitis  -given HPI and exam findings today, a serious infection or illness is unlikely. We  discussed potential etiologies, with VURI being most likely, and advised supportive care and monitoring. We discussed treatment side effects, likely course, antibiotic misuse, transmission, and signs of developing a serious illness. -she wants steroids and abx and opted to do inh steroid and delayed abx if worsening, she plans to discuss medications with her obstetrician and follow up with them as scheduled -of course, we advised to return or notify a doctor immediately if symptoms worsen or persist or new concerns arise.    Patient Instructions  Please contact your obstetrician to notify them you are doing a short course of inhaled steroid (flovent) for the bronchitis  Nasal saline twice daily  Cough drops  Tea and fluids and rest  Take the antibiotic only if not improving if ok with your obstetrician     Terressa Koyanagi.

## 2015-08-06 LAB — OB RESULTS CONSOLE GBS: STREP GROUP B AG: NEGATIVE

## 2015-08-30 ENCOUNTER — Telehealth (HOSPITAL_COMMUNITY): Payer: Self-pay | Admitting: *Deleted

## 2015-08-30 ENCOUNTER — Encounter (HOSPITAL_COMMUNITY): Payer: Self-pay | Admitting: *Deleted

## 2015-08-30 NOTE — Telephone Encounter (Signed)
Preadmission screen  

## 2015-08-31 ENCOUNTER — Inpatient Hospital Stay (HOSPITAL_COMMUNITY)
Admission: RE | Admit: 2015-08-31 | Discharge: 2015-09-02 | DRG: 775 | Disposition: A | Discharge status: Home / Self Care | Payer: Managed Care, Other (non HMO) | Source: Ambulatory Visit | Attending: Obstetrics & Gynecology | Admitting: Obstetrics & Gynecology

## 2015-08-31 ENCOUNTER — Encounter (HOSPITAL_COMMUNITY): Payer: Self-pay

## 2015-08-31 ENCOUNTER — Inpatient Hospital Stay (HOSPITAL_COMMUNITY): Payer: Managed Care, Other (non HMO) | Admitting: Anesthesiology

## 2015-08-31 DIAGNOSIS — Z87891 Personal history of nicotine dependence: Secondary | ICD-10-CM | POA: Diagnosis not present

## 2015-08-31 DIAGNOSIS — O99344 Other mental disorders complicating childbirth: Secondary | ICD-10-CM | POA: Diagnosis present

## 2015-08-31 DIAGNOSIS — F329 Major depressive disorder, single episode, unspecified: Secondary | ICD-10-CM | POA: Diagnosis present

## 2015-08-31 DIAGNOSIS — D62 Acute posthemorrhagic anemia: Secondary | ICD-10-CM | POA: Diagnosis not present

## 2015-08-31 DIAGNOSIS — O9081 Anemia of the puerperium: Secondary | ICD-10-CM | POA: Diagnosis not present

## 2015-08-31 DIAGNOSIS — K219 Gastro-esophageal reflux disease without esophagitis: Secondary | ICD-10-CM | POA: Diagnosis present

## 2015-08-31 DIAGNOSIS — O9962 Diseases of the digestive system complicating childbirth: Secondary | ICD-10-CM | POA: Diagnosis present

## 2015-08-31 DIAGNOSIS — O3663X0 Maternal care for excessive fetal growth, third trimester, not applicable or unspecified: Principal | ICD-10-CM | POA: Diagnosis present

## 2015-08-31 DIAGNOSIS — Z833 Family history of diabetes mellitus: Secondary | ICD-10-CM | POA: Diagnosis not present

## 2015-08-31 DIAGNOSIS — Z8249 Family history of ischemic heart disease and other diseases of the circulatory system: Secondary | ICD-10-CM

## 2015-08-31 DIAGNOSIS — Z3A39 39 weeks gestation of pregnancy: Secondary | ICD-10-CM | POA: Diagnosis not present

## 2015-08-31 DIAGNOSIS — Z87442 Personal history of urinary calculi: Secondary | ICD-10-CM

## 2015-08-31 LAB — CBC
HCT: 31.6 % — ABNORMAL LOW (ref 36.0–46.0)
Hemoglobin: 10.4 g/dL — ABNORMAL LOW (ref 12.0–15.0)
MCH: 27.4 pg (ref 26.0–34.0)
MCHC: 32.9 g/dL (ref 30.0–36.0)
MCV: 83.4 fL (ref 78.0–100.0)
PLATELETS: 211 10*3/uL (ref 150–400)
RBC: 3.79 MIL/uL — AB (ref 3.87–5.11)
RDW: 14.4 % (ref 11.5–15.5)
WBC: 8.9 10*3/uL (ref 4.0–10.5)

## 2015-08-31 LAB — TYPE AND SCREEN
ABO/RH(D): A POS
Antibody Screen: NEGATIVE

## 2015-08-31 LAB — RPR: RPR: NONREACTIVE

## 2015-08-31 MED ORDER — OXYCODONE-ACETAMINOPHEN 5-325 MG PO TABS: 1.0000 | ORAL_TABLET | ORAL | Status: DC | PRN

## 2015-08-31 MED ORDER — FENTANYL 2.5 MCG/ML BUPIVACAINE 1/10 % EPIDURAL INFUSION (WH - ANES)
14.0000 mL/h | INTRAMUSCULAR | Status: DC | PRN
  Administered 2015-08-31: 12.5 mL/h via EPIDURAL
  Administered 2015-08-31: 14 mL/h via EPIDURAL
  Filled 2015-08-31: qty 125

## 2015-08-31 MED ORDER — LANOLIN HYDROUS EX OINT: TOPICAL_OINTMENT | CUTANEOUS | Status: DC | PRN

## 2015-08-31 MED ORDER — OXYTOCIN 40 UNITS IN LACTATED RINGERS INFUSION - SIMPLE MED
1.0000 m[IU]/min | INTRAVENOUS | Status: DC
  Administered 2015-08-31: 2 m[IU]/min via INTRAVENOUS
  Filled 2015-08-31: qty 1000

## 2015-08-31 MED ORDER — OXYTOCIN BOLUS FROM INFUSION: 500.0000 mL | INTRAVENOUS | Status: DC

## 2015-08-31 MED ORDER — SIMETHICONE 80 MG PO CHEW
80.0000 mg | CHEWABLE_TABLET | ORAL | Status: DC | PRN
  Filled 2015-08-31: qty 1

## 2015-08-31 MED ORDER — OXYCODONE-ACETAMINOPHEN 5-325 MG PO TABS
1.0000 | ORAL_TABLET | ORAL | Status: DC | PRN
  Administered 2015-09-01: 1 via ORAL
  Filled 2015-08-31: qty 1

## 2015-08-31 MED ORDER — ACETAMINOPHEN 325 MG PO TABS: 650.0000 mg | ORAL_TABLET | ORAL | Status: DC | PRN

## 2015-08-31 MED ORDER — TERBUTALINE SULFATE 1 MG/ML IJ SOLN: 0.2500 mg | Freq: Once | INTRAMUSCULAR | Status: DC | PRN

## 2015-08-31 MED ORDER — CITRIC ACID-SODIUM CITRATE 334-500 MG/5ML PO SOLN: 30.0000 mL | ORAL | Status: DC | PRN

## 2015-08-31 MED ORDER — SERTRALINE HCL 50 MG PO TABS
150.0000 mg | ORAL_TABLET | Freq: Every day | ORAL | Status: DC
  Administered 2015-09-01 – 2015-09-02 (×2): 150 mg via ORAL
  Filled 2015-08-31 (×2): qty 1

## 2015-08-31 MED ORDER — OXYTOCIN 40 UNITS IN LACTATED RINGERS INFUSION - SIMPLE MED: 62.5000 mL/h | INTRAVENOUS | Status: DC

## 2015-08-31 MED ORDER — EPHEDRINE 5 MG/ML INJ: 10.0000 mg | INTRAVENOUS | Status: DC | PRN

## 2015-08-31 MED ORDER — ONDANSETRON HCL 4 MG PO TABS: 4.0000 mg | ORAL_TABLET | ORAL | Status: DC | PRN

## 2015-08-31 MED ORDER — LACTATED RINGERS IV SOLN: 500.0000 mL | INTRAVENOUS | Status: DC | PRN

## 2015-08-31 MED ORDER — OXYTOCIN 10 UNIT/ML IJ SOLN: 10.0000 [IU] | Freq: Once | INTRAMUSCULAR | Status: DC

## 2015-08-31 MED ORDER — BENZOCAINE-MENTHOL 20-0.5 % EX AERO: 1.0000 "application " | INHALATION_SPRAY | CUTANEOUS | Status: DC | PRN

## 2015-08-31 MED ORDER — OXYCODONE-ACETAMINOPHEN 5-325 MG PO TABS: 2.0000 | ORAL_TABLET | ORAL | Status: DC | PRN

## 2015-08-31 MED ORDER — DIBUCAINE 1 % RE OINT: 1.0000 "application " | TOPICAL_OINTMENT | RECTAL | Status: DC | PRN

## 2015-08-31 MED ORDER — DIPHENHYDRAMINE HCL 25 MG PO CAPS: 25.0000 mg | ORAL_CAPSULE | Freq: Four times a day (QID) | ORAL | Status: DC | PRN

## 2015-08-31 MED ORDER — ONDANSETRON HCL 4 MG/2ML IJ SOLN: 4.0000 mg | Freq: Four times a day (QID) | INTRAMUSCULAR | Status: DC | PRN

## 2015-08-31 MED ORDER — LIDOCAINE HCL (PF) 1 % IJ SOLN: 30.0000 mL | INTRAMUSCULAR | Status: DC | PRN

## 2015-08-31 MED ORDER — IBUPROFEN 600 MG PO TABS
600.0000 mg | ORAL_TABLET | Freq: Four times a day (QID) | ORAL | Status: DC
  Administered 2015-08-31 – 2015-09-02 (×8): 600 mg via ORAL
  Filled 2015-08-31 (×8): qty 1

## 2015-08-31 MED ORDER — LIDOCAINE HCL (PF) 1 % IJ SOLN
INTRAMUSCULAR | Status: DC | PRN
  Administered 2015-08-31 (×2): 4 mL via EPIDURAL

## 2015-08-31 MED ORDER — ONDANSETRON HCL 4 MG/2ML IJ SOLN: 4.0000 mg | INTRAMUSCULAR | Status: DC | PRN

## 2015-08-31 MED ORDER — PRENATAL MULTIVITAMIN CH
1.0000 | ORAL_TABLET | Freq: Every day | ORAL | Status: DC
  Administered 2015-09-01 – 2015-09-02 (×2): 1 via ORAL
  Filled 2015-08-31 (×2): qty 1

## 2015-08-31 MED ORDER — SENNOSIDES-DOCUSATE SODIUM 8.6-50 MG PO TABS
2.0000 | ORAL_TABLET | ORAL | Status: DC
  Administered 2015-09-01: 2 via ORAL
  Filled 2015-08-31: qty 2

## 2015-08-31 MED ORDER — LACTATED RINGERS IV SOLN
INTRAVENOUS | Status: DC
  Administered 2015-08-31: 09:00:00 via INTRAVENOUS

## 2015-08-31 MED ORDER — WITCH HAZEL-GLYCERIN EX PADS: 1.0000 "application " | MEDICATED_PAD | CUTANEOUS | Status: DC | PRN

## 2015-08-31 MED ORDER — DIPHENHYDRAMINE HCL 50 MG/ML IJ SOLN: 12.5000 mg | INTRAMUSCULAR | Status: DC | PRN

## 2015-08-31 MED ORDER — PHENYLEPHRINE 40 MCG/ML (10ML) SYRINGE FOR IV PUSH (FOR BLOOD PRESSURE SUPPORT)
80.0000 ug | PREFILLED_SYRINGE | INTRAVENOUS | Status: DC | PRN
  Filled 2015-08-31: qty 20

## 2015-08-31 NOTE — Anesthesia Preprocedure Evaluation (Signed)
Anesthesia Evaluation  Patient identified by MRN, date of birth, ID band Patient awake    Reviewed: Allergy & Precautions, Patient's Chart, lab work & pertinent test results  Airway Mallampati: III  TM Distance: >3 FB Neck ROM: Full    Dental no notable dental hx. (+) Teeth Intact   Pulmonary former smoker,    Pulmonary exam normal breath sounds clear to auscultation       Cardiovascular negative cardio ROS Normal cardiovascular exam Rhythm:Regular Rate:Normal     Neuro/Psych  Headaches, PSYCHIATRIC DISORDERS Depression  Neuromuscular disease    GI/Hepatic Neg liver ROS, GERD  ,  Endo/Other  Obesity PCOS  Renal/GU Renal InsufficiencyRenal disease  negative genitourinary   Musculoskeletal  (+) Fibromyalgia -Chronic LBP   Abdominal (+) + obese,   Peds  Hematology  (+) anemia ,   Anesthesia Other Findings   Reproductive/Obstetrics (+) Pregnancy                             Anesthesia Physical Anesthesia Plan  ASA: II  Anesthesia Plan: Epidural   Post-op Pain Management:    Induction:   Airway Management Planned: Natural Airway  Additional Equipment:   Intra-op Plan:   Post-operative Plan:   Informed Consent: I have reviewed the patients History and Physical, chart, labs and discussed the procedure including the risks, benefits and alternatives for the proposed anesthesia with the patient or authorized representative who has indicated his/her understanding and acceptance.     Plan Discussed with: Anesthesiologist  Anesthesia Plan Comments:         Anesthesia Quick Evaluation

## 2015-08-31 NOTE — H&P (Signed)
OB ADMISSION/ HISTORY & PHYSICAL:  Admission Date: 08/31/2015  7:40 AM  Admit Diagnosis: 39.[redacted] weeks gestation, suspected macrosomia, favorable cervix  Vanessa Mercer is a 29 y.o. female presenting for IOL.  Prenatal History: G2P1001   EDC:09/07/2015, Date entered prior to episode creation   Prenatal care at [facility]  Primary Ob Provider: Marlinda Mike, CNM Prenatal course complicated by obesity, depression-stable on Zoloft, hydronephrosis and nephrolithiasis (5mm) on Rt, S>D-LGA EFW 7-5  wks w/AC 99%, H/o shoulder dystocia w/G1, persistent fetal pyelectasis.  Prenatal Labs: ABO, Rh: A (03/04 0000) POS Antibody: Negative (03/04 0000) Rubella: Nonimmune (03/04 0000)  RPR: Nonreactive (03/04 0000)  HBsAg: Negative (03/04 0000)  HIV: Non-reactive (03/04 0000)  GBS: Negative (09/06 0000)  1 hr GTT: 131 Genetic screen: nml  Medical / Surgical History :  Past medical history:  Past Medical History  Diagnosis Date  . Headache(784.0)   . PCOS (polycystic ovarian syndrome)   . Kidney stones   . Other nonspecific findings on examination of blood(790.99)     elevated serum creatinine  . History of chicken pox   . PP care - s/p SVD 2/19 01/20/2012  . Maternal anemia, with delivery 01/20/2012  . Subchorionic hemorrhage in first trimester 01/17/2015  . Bleeding in early pregnancy 01/17/2015  . High serum creatinine   . Fibromyalgia   . Back pain   . Keratosis, seborrheic   . Hemorrhoids without complication   . Depression     zoloft     Past surgical history:  Past Surgical History  Procedure Laterality Date  . Wisdom tooth extraction    . Knee surgery      Family History:  Family History  Problem Relation Age of Onset  . Heart disease Maternal Grandmother   . Diabetes Maternal Grandfather   . Heart disease Maternal Grandfather   . Heart attack Maternal Grandfather   . Diabetes Paternal Grandfather   . Cancer Paternal Grandfather     prostate  .  Urolithiasis Mother   . Migraines Mother   . Migraines Sister   . Arthritis Sister     psoriatic arthritis     Social History:  reports that she quit smoking about 4 years ago. Her smoking use included Cigarettes. She has a 1 pack-year smoking history. She has never used smokeless tobacco. She reports that she does not drink alcohol or use illicit drugs.  Allergies: Review of patient's allergies indicates no known allergies.   Current Medications at time of admission:  Prior to Admission medications   Medication Sig Start Date End Date Taking? Authorizing Provider  busPIRone (BUSPAR) 15 MG tablet Take 15 mg by mouth daily.    Yes Historical Provider, MD  cyclobenzaprine (FLEXERIL) 10 MG tablet Take 10 mg by mouth at bedtime.  05/02/15  Yes Historical Provider, MD  MAGNESIUM PO Take 1 tablet by mouth daily.   Yes Historical Provider, MD  pyridOXINE (VITAMIN B-6) 100 MG tablet Take 100 mg by mouth daily.   Yes Historical Provider, MD  riboflavin (VITAMIN B-2) 100 MG TABS tablet Take 400 mg by mouth daily.    Yes Historical Provider, MD  sertraline (ZOLOFT) 100 MG tablet Take 150 mg by mouth daily.  02/26/15  Yes Historical Provider, MD  azithromycin (ZITHROMAX) 250 MG tablet 2 tabs on the first day, then 1 tab daily Patient not taking: Reported on 08/31/2015 07/23/15   Terressa Koyanagi, DO  fluticasone (FLOVENT HFA) 44 MCG/ACT inhaler Inhale 2 puffs  into the lungs 2 (two) times daily. For 5-7 days Patient not taking: Reported on 08/31/2015 07/23/15   Terressa Koyanagi, DO  Prenatal Vit-Fe Fumarate-FA (PRENATAL MULTIVITAMIN) TABS Take 1 tablet by mouth daily at 12 noon.    Historical Provider, MD     Review of Systems: +FM +ctx, irregular +mucous plug No LOF No VB  Physical Exam:  VS: Blood pressure 126/80, pulse 90, temperature 98.2 F (36.8 C), temperature source Oral, resp. rate 16, height  (1.575 m), weight 88.451 kg (195 lb).  General: alert and oriented, appears comfortable Heart:  RRR Lungs: Clear lung fields Abdomen: Gravid, soft and non-tender, non-distended / uterus: gravid, non-tender Extremities: No edema Genitalia / VE: Dilation: 4 Effacement (%): 70 Station: -3 Exam by:: [employer] CNM; EFW: 8# FHR: baseline rate 145 / variability mod / accelerations + / no decelerations TOCO: irregular  Assessment: [redacted] weeks gestation Labor: IOL FHR category I GBS: neg  Plan:  Pitocin IOL, continuous EFM, epidural/analgesia prn, anticipate SVD, MD on stand-by at delivery for SD. Dr. Juliene Pina notified of admission / plan of care   Lawernce Pitts MSN, CNM 08/31/2015, 8:52 AM

## 2015-08-31 NOTE — Lactation Note (Signed)
This note was copied from the chart of Vanessa Mercer. Lactation Consultation Note Initial visit at 5 hours of age.  Mom reports difficulties with older child, flat nipples, used NS and mostly pumped with good supply.  This baby is not latching well (at 5 hours of age).  Mom has everted nipples with short shaft and compressible tissue more so on right breast.  Mom has history of PCOS with adequate developed breasts.  Mom has large volume of colostrum.  Mom has hand pump and gave baby ebm with a personal bottle she brought from home.  Encouraged mom to continue to attempt latch and not use bottle nipple if she is wanting baby to latch.  Discussed options of using syringe for finger feeding or foley cup as needed.  Instructed mom on proper application of #24 NS she brought from home.  Mom will try to latch baby without and knows to post pump 4-6 times daily if she continues to need to use NS. Encouraged mom to give baby more time to learn to latch and we have DEBP available for her to use if needed.  [facility] LC resources given and discussed.  Encouraged to feed with early cues on demand.  Early newborn behavior discussed.  Mom to call for assist as needed.     Patient Name: Vanessa Shaylie Windsor [id number] Date: 08/31/2015 Reason for consult: Initial assessment   Maternal Data Has patient been taught Hand Expression?: Yes Does the patient have breastfeeding experience prior to this delivery?: Yes  Feeding    LATCH Score/Interventions                      Lactation Tools Discussed/Used Pump Review: Setup, frequency, and cleaning   Consult Status Consult Status: Follow-up Date: 09/01/15 Follow-up type: In-patient    Jannifer Rodney 08/31/2015, 10:12 PM

## 2015-08-31 NOTE — Progress Notes (Signed)
Subjective:   Comfortable with epidural, feeling some pressure.  Objective:   VS: Blood pressure 125/81, pulse 89, temperature 98.3 F (36.8 C), temperature source Axillary, resp. rate 18, height $Remov 1.575 m), weight 88.451 kg (195 lb), SpO2 99 %. FHR: baseline 145 / variability mod / accelerations + / variable decelerations Toco: contractions every 2-3 minutes Cervix: Dilation: 8 Effacement (%): 90 Station: -1 Exam by:: mBhambri,cnm Membranes: clear, bloody show Pitocin: 10 mu/min  Assessment:  Labor: active FHR category II  Plan:  Reposition with peanut ball on right (cervix thicker), anticipate rapid progression, MD at bedside during delivery-prep for potential SD. Dr. Juliene Pina updated with A/P.      Vanessa Mercer, N MSN, CNM 08/31/2015, 4:04 PM

## 2015-08-31 NOTE — Progress Notes (Signed)
Subjective:   Comfortable with epidural  Objective:   VS: Blood pressure 112/90, pulse 81, temperature 97.4 F (36.3 C), temperature source Oral, resp. rate 18, height  (1.575 m), weight 88.451 kg (195 lb), SpO2 99 %. FHR: baseline 140 / variability mod / accelerations + / no decelerations Toco: contractions every 3 minutes Cervix: Dilation: 6 Effacement (%): 80 Station: -1 Exam by:: mbhambri,cnm Membranes: AROM clear, bloody show Pitocin: 12 mu/min  Assessment:  Labor: active FHR category I  Plan:  Progressing well, anticipate SVD, anticipate maneuvers for SD, MD for back-up.     Donette Larry, N MSN, CNM 08/31/2015, 12:04 PM

## 2015-08-31 NOTE — Anesthesia Procedure Notes (Signed)
Epidural Patient location during procedure: OB Start time: 08/31/2015 11:01 AM  Staffing Anesthesiologist: Mal Amabile Performed by: anesthesiologist   Preanesthetic Checklist Completed: patient identified, site marked, surgical consent, pre-op evaluation, timeout performed, IV checked, risks and benefits discussed and monitors and equipment checked  Epidural Patient position: sitting Prep: site prepped and draped and DuraPrep Patient monitoring: continuous pulse ox and blood pressure Approach: midline Location: L3-L4 Injection technique: LOR air  Needle:  Needle type: Tuohy  Needle gauge: 17 G Needle length: 9 cm and 9 Needle insertion depth: 5 cm cm Catheter type: closed end flexible Catheter size: 19 Gauge Catheter at skin depth: 10 cm Test dose: negative and Other  Assessment Events: blood not aspirated, injection not painful, no injection resistance, negative IV test and no paresthesia  Additional Notes Patient identified. Risks and benefits discussed including failed block, incomplete  Pain control, post dural puncture headache, nerve damage, paralysis, blood pressure Changes, nausea, vomiting, reactions to medications-both toxic and allergic and post Partum back pain. All questions were answered. Patient expressed understanding and wished to proceed. Sterile technique was used throughout procedure. Epidural site was Dressed with sterile barrier dressing. No paresthesias, signs of intravascular injection Or signs of intrathecal spread were encountered.  Patient was more comfortable after the epidural was dosed. Please see RN's note for documentation of vital signs and FHR which are stable.

## 2015-09-01 LAB — CBC
HCT: 27.8 % — ABNORMAL LOW (ref 36.0–46.0)
HEMOGLOBIN: 9.2 g/dL — AB (ref 12.0–15.0)
MCH: 27.7 pg (ref 26.0–34.0)
MCHC: 33.1 g/dL (ref 30.0–36.0)
MCV: 83.7 fL (ref 78.0–100.0)
PLATELETS: 183 10*3/uL (ref 150–400)
RBC: 3.32 MIL/uL — ABNORMAL LOW (ref 3.87–5.11)
RDW: 14.3 % (ref 11.5–15.5)
WBC: 10.9 10*3/uL — ABNORMAL HIGH (ref 4.0–10.5)

## 2015-09-01 MED ORDER — MEASLES, MUMPS & RUBELLA VAC ~~LOC~~ INJ
0.5000 mL | INJECTION | Freq: Once | SUBCUTANEOUS | Status: AC
  Administered 2015-09-02: 0.5 mL via SUBCUTANEOUS
  Filled 2015-09-01 (×2): qty 0.5

## 2015-09-01 MED ORDER — POLYSACCHARIDE IRON COMPLEX 150 MG PO CAPS
150.0000 mg | ORAL_CAPSULE | Freq: Every day | ORAL | Status: DC
  Administered 2015-09-01: 150 mg via ORAL
  Filled 2015-09-01: qty 1

## 2015-09-01 NOTE — Progress Notes (Signed)
Patient given MMR VIS. 

## 2015-09-01 NOTE — Progress Notes (Signed)
fhr tracing from 08/31/2015 [id number] traced under pt Vanessa Mercer mrn # [phone].

## 2015-09-01 NOTE — Anesthesia Postprocedure Evaluation (Signed)
  Anesthesia Post-op Note  Patient: Vanessa Mercer  Procedure(s) Performed: * No procedures listed *  Patient Location: Mother/Baby  Anesthesia Type:Epidural  Level of Consciousness: awake, alert , oriented and patient cooperative  Airway and Oxygen Therapy: Patient Spontanous Breathing  Post-op Pain: mild  Post-op Assessment: Post-op Vital signs reviewed, Patient's Cardiovascular Status Stable, Respiratory Function Stable, Patent Airway, No signs of Nausea or vomiting, Adequate PO intake, Pain level controlled, No headache and No backache              Post-op Vital Signs: Reviewed and stable  Last Vitals:  Filed Vitals:   09/01/15 0617  BP: 115/76  Pulse: 80  Temp: 36.7 C  Resp: 18    Complications: No apparent anesthesia complications

## 2015-09-01 NOTE — Progress Notes (Signed)
PPD #1- SVD  Subjective:   Reports feeling well, had shower Tolerating po/ No nausea or vomiting Bleeding is light Pain controlled with Motrin Up ad lib / ambulatory / voiding without problems Newborn: breastfeeding  / Circumcision: planning  Objective:   VS:  VS:  Filed Vitals:   08/31/15 1801 08/31/15 1951 09/01/15 0044 09/01/15 0617  BP: 115/85 130/74 122/76 115/76  Pulse: 83 91 80 80  Temp:  98.6 F (37 C) 98.3 F (36.8 C) 98 F (36.7 C)  TempSrc:  Oral Oral Oral  Resp: _0 Height:      Weight:      SpO2:        LABS:  Recent Labs  08/31/15 0852 09/01/15 0548  WBC 8.9 10.9*  HGB 10.4* 9.2*  PLT 211 183   Blood type: --/--/A POS (10/01 1282) Rubella: Nonimmune (03/04 0000)   I&O: Intake/Output      10/01 0701 - 10/02 0700 10/02 0701 - 10/03 0700   Urine (mL/kg/hr) 500    Blood 150    Total Output 650     Net -650            Physical Exam: Alert and oriented x3 Abdomen: soft, non-tender, non-distended  Fundus: firm, non-tender, U-2 Perineum: Well approximated, no significant erythema, edema, or drainage; healing well. Lochia: small Extremities: No edema, no calf pain or tenderness   Assessment:  PPD #1 G2P2002/ S/P:induced vaginal, 1st degree laceration IDA with compounding ABL anemia Rubella non-immune Depression-stable on Zoloft  Doing well   Plan: MMR prior to d/c  Routine post partum orders Anticipate D/C home tomorrow   Julianne Handler, N MSN, CNM 09/01/2015, 10:04 AM

## 2015-09-01 NOTE — Lactation Note (Signed)
This note was copied from the chart of Vanessa Mercer. Lactation Consultation Note  Patient Name: Vanessa Mercer [id number] Date: 09/01/2015 Reason for consult: Follow-up assessment   With this experienced breast feeding mom, now 22 hours old and ter. The baby was in his crib, content and asleep. Mom has trouble latching the baby, but did get him latching once at 2 am, for a 15 minute feedng. Mom has been pumping and bottle feeding up to 15 ml's of colostrum. Om thought she "had no milk today" I showed her how to hand express, and showed her that she has a good flow of colostrum, which mom was happy to see. I asked mom if she wanted help with latching later, but she said she has been successful, and will keep trying. I told her Dyke Maes was on again this evening, and to call her as needed.    Maternal Data    Feeding    LATCH Score/Interventions                      Lactation Tools Discussed/Used     Consult Status Consult Status: PRN Follow-up type: Call as needed    Alfred Levins 09/01/2015, 3:13 PM

## 2015-09-02 MED ORDER — IBUPROFEN 600 MG PO TABS: 600.0000 mg | ORAL_TABLET | Freq: Four times a day (QID) | ORAL | Status: DC

## 2015-09-02 MED ORDER — POLYSACCHARIDE IRON COMPLEX 150 MG PO CAPS: 150.0000 mg | ORAL_CAPSULE | Freq: Every day | ORAL | Status: AC

## 2015-09-02 NOTE — Progress Notes (Signed)
MOB was referred for history of depression/anxiety.  Referral is screened out by Clinical Social Worker because none of the following criteria appear to apply: -History of anxiety/depression during this pregnancy, or of post-partum depression. - Diagnosis of anxiety and/or depression within last 3 years or -MOB's symptoms are currently being treated with medication and/or therapy. MOB is currently prescribed Zoloft. No concerns noted in her chart during her pregnancy.   Please contact the Clinical Social Worker if needs arise or upon MOB request.  

## 2015-09-02 NOTE — Discharge Summary (Signed)
Obstetric Discharge Summary Reason for Admission: induction of labor Prenatal Procedures: ultrasound Intrapartum Procedures: spontaneous vaginal delivery Postpartum Procedures: none Complications-Operative and Postpartum: 1st degree perineal laceration HEMOGLOBIN  Date Value Ref Range Status  09/01/2015 9.2* 12.0 - 15.0 g/dL Final   HCT  Date Value Ref Range Status  09/01/2015 27.8* 36.0 - 46.0 % Final    Physical Exam:  General: alert, cooperative, fatigued and no distress Lochia: appropriate Uterine Fundus: firm, U-1 Perineum: healing well, no significant drainage, no dehiscence, no significant erythema DVT Evaluation: No evidence of DVT seen on physical exam. Negative Homan's sign. No cords or calf tenderness. No significant calf/ankle edema.  Discharge Diagnoses: Term Pregnancy-delivered  Discharge Information: Date: 09/02/2015 Activity: pelvic rest Diet: routine Medications: PNV, Ibuprofen and Iron Condition: stable Instructions: refer to practice specific booklet Discharge to: home Follow-up Information    Follow up with Marlinda Mike, CNM. Go in 6 weeks.   Specialty:  Obstetrics and Gynecology   Why:  postpartum visit   Contact information:   [address] [place] [address] [phone]       Newborn Data: Live born female on 08/31/2016 Birth Weight: 7 lb 6 oz (3345 g) APGAR: 3, 7  Home with mother.  Vanessa Mercer, M MSN, CNM 09/02/2015, 9:21 AM

## 2015-09-02 NOTE — Discharge Instructions (Signed)
Breast Pumping Tips °If you are breastfeeding, there may be times when you cannot feed your baby directly. Returning to work or going on a trip are common examples. Pumping allows you to store breast milk and feed it to your baby later.  °You may not get much milk when you first start to pump. Your breasts should start to make more after a few days. If you pump at the times you usually feed your baby, you may be able to keep making enough milk to feed your baby without also using formula. The more often you pump, the more milk you will produce.  °WHEN SHOULD I PUMP?  °· You can begin to pump soon after delivery. However, some experts recommend waiting about 4 weeks before giving your infant a bottle to make sure breastfeeding is going well.  °· If you plan to return to work, begin pumping a few weeks before. This will help you develop techniques that work best for you. It also lets you build up a supply of breast milk.   °· When you are with your infant, feed on demand and pump after each feeding.   °· When you are away from your infant for several hours, pump for about 15 minutes every 2-3 hours. Pump both breasts at the same time if you can.   °· If your infant has a formula feeding, make sure to pump around the same time.     °· If you drink any alcohol, wait 2 hours before pumping.   °HOW DO I PREPARE TO PUMP? °Your let-down reflex is the natural reaction to stimulation that makes your breast milk flow. It is easier to stimulate this reflex when you are relaxed. Find relaxation techniques that work for you. If you have difficulty with your let-down reflex, try these methods:  °· Smell one of your infant's blankets or an item of clothing.   °· Look at a picture or video of your infant.   °· Sit in a quiet, private space.   °· Massage the breast you plan to pump.   °· Place soothing warmth on the breast.   °· Play relaxing music.   °WHAT ARE SOME GENERAL BREAST PUMPING TIPS? °· Wash your hands before you pump. You  do not need to wash your nipples or breasts. °· There are three ways to pump. °¨ You can use your hand to massage and compress your breast. °¨ You can use a handheld manual pump. °¨ You can use an electric pump.   °· Make sure the suction cup (flange) on the breast pump is the right size. Place the flange directly over the nipple. If it is the wrong size or placed the wrong way, it may be painful and cause nipple damage.   °· If pumping is uncomfortable, apply a small amount of purified or modified lanolin to your nipple and areola. °· If you are using an electric pump, adjust the speed and suction power to be more comfortable. °· If pumping is painful or if you are not getting very much milk, you may need a different type of pump. A lactation consultant can help you determine what type of pump to use.   °· Keep a full water bottle near you at all times. Drinking lots of fluid helps you make more milk.  °· You can store your milk to use later. Pumped breast milk can be stored in a sealable, sterile container or plastic bag. Label all stored breast milk with the date you pumped it. °¨ Milk can stay out at room temperature for up to 8 hours. °¨   You can store your milk in the refrigerator for up to 8 days. °¨ You can store your milk in the freezer for 3 months. Thaw frozen milk using warm water. Do not put it in the microwave. °· Do not smoke. Smoking can lower your milk supply and harm your infant. If you need help quitting, ask your health care provider to recommend a program.   °WHEN SHOULD I CALL MY HEALTH CARE PROVIDER OR A LACTATION CONSULTANT? °· You are having trouble pumping. °· You are concerned that you are not making enough milk. °· You have nipple pain, soreness, or redness. °· You want to use birth control. Birth control pills may lower your milk supply. Talk to your health care provider about your options. °Document Released: 05/06/2010 Document Revised: 11/21/2013 Document Reviewed:  09/08/2013 °Exit[employer]® Patient Information ©2015 [employer]. This information is not intended to replace advice given to you by your health care provider. Make sure you discuss any questions you have with your health care provider. ° °Nutrition for the New Mother  °A new mother needs good health and nutrition so she can have energy to take care of a new baby. Whether a mother breastfeeds or formula feeds the baby, it is important to have a well-balanced diet. Foods from all the food groups should be chosen to meet the new mother's energy needs and to give her the nutrients needed for repair and healing.  °A HEALTHY EATING PLAN °The My [employer] plan for Moms outlines what you should eat to help you and your baby stay healthy. The energy and amount of food you need depends on whether or not you are breastfeeding. If you are breastfeeding you will need more nutrients. If you choose not to breastfeed, your nutrition goal should be to return to a healthy weight. Limiting calories may be needed if you are not breastfeeding.  °HOME CARE INSTRUCTIONS  °· For a personal plan based on your unique needs, see your Registered Dietitian or visit www.mypyramid.gov. °· Eat a variety of foods. The plan below will help guide you. The following chart has a suggested daily meal plan from the [employer] for Moms. °· Eat a variety of fruits and vegetables. °· Eat more dark green and orange vegetables and cooked dried beans. °· Make half your grains whole grains. Choose whole instead of refined grains. °· Choose low-fat or lean meats and poultry. °· Choose low-fat or fat-free dairy products like milk, cheese, or yogurt. °Fruits °· Breastfeeding: 2 cups °· Non-Breastfeeding: 2 cups °· What Counts as a serving? °¨ 1 cup of fruit or juice. °¨ ½ cup dried fruit. °Vegetables °· Breastfeeding: 3 cups °· Non-Breastfeeding: 2 ½ cups °· What Counts as a serving? °¨ 1 cup raw or cooked vegetables. °¨ Juice or 2 cups raw leafy  vegetables. °Grains °· Breastfeeding: 8 oz °· Non-Breastfeeding: 6 oz °· What Counts as a serving? °¨ 1 slice bread. °¨ 1 oz ready-to-eat cereal. °¨ ½ cup cooked pasta, rice, or cereal. °Meat and Beans °· Breastfeeding: 6 ½ oz °· Non-Breastfeeding: 5 ½ oz °· What Counts as a serving? °¨ 1 oz lean meat, poultry, or fish °¨ ¼ cup cooked dry beans °¨ ½ oz nuts or 1 egg °¨ 1 tbs peanut butter °Milk °· Breastfeeding: 3 cups °· Non-Breastfeeding: 3 cups °· What Counts as a serving? °¨ 1 cup milk. °¨ 8 oz yogurt. °¨ 1 ½ oz cheese. °¨ 2 oz processed cheese. °TIPS FOR THE BREASTFEEDING MOM °· Rapid weight   loss is not suggested when you are breastfeeding. By simply breastfeeding, you will be able to lose the weight gained during your pregnancy. Your caregiver can keep track of your weight and tell you if your weight loss is appropriate. °· Be sure to drink fluids. You may notice that you are thirstier than usual. A suggestion is to drink a glass of water or other beverage whenever you breastfeed. °· Avoid alcohol as it can be passed into your breast milk. °· Limit caffeine drinks to no more than 2 to 3 cups per day. °· You may need to keep taking your prenatal vitamin while you are breastfeeding. Talk with your caregiver about taking a vitamin or supplement. °RETURING TO A HEALTHY WEIGHT °· The My [employer] Plan for Moms will help you return to a healthy weight. It will also provide the nutrients you need. °· You may need to limit "empty" calories. These include: °¨ High fat foods like fried foods, fatty meats, fast food, butter, and mayonnaise. °¨ High sugar foods like sodas, jelly, candy, and sweets. °· Be physically active. Include 30 minutes of exercise or more each day. Choose an activity you like such as walking, swimming, biking, or aerobics. Check with your caregiver before you start to exercise. °Document Released: 02/23/2008 Document Revised: 02/08/2012 Document Reviewed: 02/23/2008 °ExitCare® Patient Information  ©2015 [employer]. This information is not intended to replace advice given to you by your health care provider. Make sure you discuss any questions you have with your health care provider. °Postpartum Depression and Baby Blues °The postpartum period begins right after the birth of a baby. During this time, there is often a great amount of joy and excitement. It is also a time of many changes in the life of the parents. Regardless of how many times a mother gives birth, each child brings new challenges and dynamics to the family. It is not unusual to have feelings of excitement along with confusing shifts in moods, emotions, and thoughts. All mothers are at risk of developing postpartum depression or the "baby blues." These mood changes can occur right after giving birth, or they may occur many months after giving birth. The baby blues or postpartum depression can be mild or severe. Additionally, postpartum depression can go away rather quickly, or it can be a long-term condition.  °CAUSES °Raised hormone levels and the rapid drop in those levels are thought to be a main cause of postpartum depression and the baby blues. A number of hormones change during and after pregnancy. Estrogen and progesterone usually decrease right after the delivery of your baby. The levels of thyroid hormone and various cortisol steroids also rapidly drop. Other factors that play a role in these mood changes include major life events and genetics.  °RISK FACTORS °If you have any of the following risks for the baby blues or postpartum depression, know what symptoms to watch out for during the postpartum period. Risk factors that may increase the likelihood of getting the baby blues or postpartum depression include: °· Having a personal or family history of depression.   °· Having depression while being pregnant.   °· Having premenstrual mood issues or mood issues related to oral contraceptives. °· Having a lot of life stress.   °· Having  marital conflict.   °· Lacking a social support network.   °· Having a baby with special needs.   °· Having health problems, such as diabetes.   °SIGNS AND SYMPTOMS °Symptoms of baby blues include: °· Brief changes in mood, such as going   from extreme happiness to sadness. °· Decreased concentration.   °· Difficulty sleeping.   °· Crying spells, tearfulness.   °· Irritability.   °· Anxiety.   °Symptoms of postpartum depression typically begin within the first month after giving birth. These symptoms include: °· Difficulty sleeping or excessive sleepiness.   °· Marked weight loss.   °· Agitation.   °· Feelings of worthlessness.   °· Lack of interest in activity or food.   °Postpartum psychosis is a very serious condition and can be dangerous. Fortunately, it is rare. Displaying any of the following symptoms is cause for immediate medical attention. Symptoms of postpartum psychosis include:  °· Hallucinations and delusions.   °· Bizarre or disorganized behavior.   °· Confusion or disorientation.   °DIAGNOSIS  °A diagnosis is made by an evaluation of your symptoms. There are no medical or lab tests that lead to a diagnosis, but there are various questionnaires that a health care provider may use to identify those with the baby blues, postpartum depression, or psychosis. Often, a screening tool called the Edinburgh Postnatal Depression Scale is used to diagnose depression in the postpartum period.  °TREATMENT °The baby blues usually goes away on its own in 1-2 weeks. Social support is often all that is needed. You will be encouraged to get adequate sleep and rest. Occasionally, you may be given medicines to help you sleep.  °Postpartum depression requires treatment because it can last several months or longer if it is not treated. Treatment may include individual or group therapy, medicine, or both to address any social, physiological, and psychological factors that may play a role in the depression. Regular exercise, a  healthy diet, rest, and social support may also be strongly recommended.  °Postpartum psychosis is more serious and needs treatment right away. Hospitalization is often needed. °HOME CARE INSTRUCTIONS °· Get as much rest as you can. Nap when the baby sleeps.   °· Exercise regularly. Some women find yoga and walking to be beneficial.   °· Eat a balanced and nourishing diet.   °· Do little things that you enjoy. Have a cup of tea, take a bubble bath, read your favorite magazine, or listen to your favorite music. °· Avoid alcohol.   °· Ask for help with household chores, cooking, grocery shopping, or running errands as needed. Do not try to do everything.   °· Talk to people close to you about how you are feeling. Get support from your partner, family members, friends, or other new moms. °· Try to stay positive in how you think. Think about the things you are grateful for.   °· Do not spend a lot of time alone.   °· Only take over-the-counter or prescription medicine as directed by your health care provider. °· Keep all your postpartum appointments.   °· Let your health care provider know if you have any concerns.   °SEEK MEDICAL CARE IF: °You are having a reaction to or problems with your medicine. °SEEK IMMEDIATE MEDICAL CARE IF: °· You have suicidal feelings.   °· You think you may harm the baby or someone else. °MAKE SURE YOU: °· Understand these instructions. °· Will watch your condition. °· Will get help right away if you are not doing well or get worse. °Document Released: 08/20/2004 Document Revised: 11/21/2013 Document Reviewed: 08/28/2013 °Exit[employer]® Patient Information ©2015 [employer]. This information is not intended to replace advice given to you by your health care provider. Make sure you discuss any questions you have with your health care provider. °Postpartum Care After Vaginal Delivery °After you deliver your newborn (postpartum period), the usual stay in   the hospital is 24-72 hours. If there were  problems with your labor or delivery, or if you have other medical problems, you might be in the hospital longer.  °While you are in the hospital, you will receive help and instructions on how to care for yourself and your newborn during the postpartum period.  °While you are in the hospital: °· Be sure to tell your nurses if you have pain or discomfort, as well as where you feel the pain and what makes the pain worse. °· If you had an incision made near your vagina (episiotomy) or if you had some tearing during delivery, the nurses may put ice packs on your episiotomy or tear. The ice packs may help to reduce the pain and swelling. °· If you are breastfeeding, you may feel uncomfortable contractions of your uterus for a couple of weeks. This is normal. The contractions help your uterus get back to normal size. °· It is normal to have some bleeding after delivery. °· For the first 1-3 days after delivery, the flow is red and the amount may be similar to a period. °· It is common for the flow to start and stop. °· In the first few days, you may pass some small clots. Let your nurses know if you begin to pass large clots or your flow increases. °· Do not  flush blood clots down the toilet before having the nurse look at them. °· During the next 3-10 days after delivery, your flow should become more watery and pink or brown-tinged in color. °· Ten to fourteen days after delivery, your flow should be a small amount of yellowish-white discharge. °· The amount of your flow will decrease over the first few weeks after delivery. Your flow may stop in 6-8 weeks. Most women have had their flow stop by 12 weeks after delivery. °· You should change your sanitary pads frequently. °· Wash your hands thoroughly with soap and water for at least 20 seconds after changing pads, using the toilet, or before holding or feeding your newborn. °· You should feel like you need to empty your bladder within the first 6-8 hours after  delivery. °· In case you become weak, lightheaded, or faint, call your nurse before you get out of bed for the first time and before you take a shower for the first time. °· Within the first few days after delivery, your breasts may begin to feel tender and full. This is called engorgement. Breast tenderness usually goes away within 48-72 hours after engorgement occurs. You may also notice milk leaking from your breasts. If you are not breastfeeding, do not stimulate your breasts. Breast stimulation can make your breasts produce more milk. °· Spending as much time as possible with your newborn is very important. During this time, you and your newborn can feel close and get to know each other. Having your newborn stay in your room (rooming in) will help to strengthen the bond with your newborn.  It will give you time to get to know your newborn and become comfortable caring for your newborn. °· Your hormones change after delivery. Sometimes the hormone changes can temporarily cause you to feel sad or tearful. These feelings should not last more than a few days. If these feelings last longer than that, you should talk to your caregiver. °· If desired, talk to your caregiver about methods of family planning or contraception. °· Talk to your caregiver about immunizations. Your caregiver may want you to have the   following immunizations before leaving the hospital: °· Tetanus, diphtheria, and pertussis (Tdap) or tetanus and diphtheria (Td) immunization. It is very important that you and your family (including grandparents) or others caring for your newborn are up-to-date with the Tdap or Td immunizations. The Tdap or Td immunization can help protect your newborn from getting ill. °· Rubella immunization. °· Varicella (chickenpox) immunization. °· Influenza immunization. You should receive this annual immunization if you did not receive the immunization during your pregnancy. °Document Released: 09/13/2007 Document  Revised: 08/10/2012 Document Reviewed: 07/13/2012 °[employer]® Patient Information ©2015 [employer]. This information is not intended to replace advice given to you by your health care provider. Make sure you discuss any questions you have with your health care provider. °Breastfeeding and Mastitis °Mastitis is inflammation of the breast tissue. It can occur in women who are breastfeeding. This can make breastfeeding painful. Mastitis will sometimes go away on its own. Your health care provider will help determine if treatment is needed. °CAUSES °Mastitis is often associated with a blocked milk (lactiferous) duct. This can happen when too much milk builds up in the breast. Causes of excess milk in the breast can include: °· Poor latch-on. If your baby is not latched onto the breast properly, she or he may not empty your breast completely while breastfeeding. °· Allowing too much time to pass between feedings. °· Wearing a bra or other clothing that is too tight. This puts extra pressure on the lactiferous ducts so milk does not flow through them as it should. °Mastitis can also be caused by a bacterial infection. Bacteria may enter the breast tissue through cuts or openings in the skin. In women who are breastfeeding, this may occur because of cracked or irritated skin. Cracks in the skin are often caused when your baby does not latch on properly to the breast. °SIGNS AND SYMPTOMS °· Swelling, redness, tenderness, and pain in an area of the breast. °· Swelling of the glands under the arm on the same side. °· Fever may or may not accompany mastitis. °If an infection is allowed to progress, a collection of pus (abscess) may develop. °DIAGNOSIS  °Your health care provider can usually diagnose mastitis based on your symptoms and a physical exam. Tests may be done to help confirm the diagnosis. These may include: °· Removal of pus from the breast by applying pressure to the area. This pus can be examined in the lab to  determine which bacteria are present. If an abscess has developed, the fluid in the abscess can be removed with a needle. This can also be used to confirm the diagnosis and determine the bacteria present. In most cases, pus will not be present. °· Blood tests to determine if your body is fighting a bacterial infection. °· Mammogram or ultrasound tests to rule out other problems or diseases. °TREATMENT  °Mastitis that occurs with breastfeeding will sometimes go away on its own. Your health care provider may choose to wait 24 hours after first seeing you to decide whether a prescription medicine is needed. If your symptoms are worse after 24 hours, your health care provider will likely prescribe an antibiotic medicine to treat the mastitis. He or she will determine which bacteria are most likely causing the infection and will then select an appropriate antibiotic medicine. This is sometimes changed based on the results of tests performed to identify the bacteria, or if there is no response to the antibiotic medicine selected. Antibiotic medicines are usually given by mouth. You   may also be given medicine for pain. °HOME CARE INSTRUCTIONS °· Only take over-the-counter or prescription medicines for pain, fever, or discomfort as directed by your health care provider. °· If your health care provider prescribed an antibiotic medicine, take the medicine as directed. Make sure you finish it even if you start to feel better. °· Do not wear a tight or underwire bra. Wear a soft, supportive bra. °· Increase your fluid intake, especially if you have a fever. °· Continue to empty the breast. Your health care provider can tell you whether this milk is safe for your infant or needs to be thrown out. You may be told to stop nursing until your health care provider thinks it is safe for your baby. Use a breast pump if you are advised to stop nursing. °· Keep your nipples clean and dry. °· Empty the first breast completely before going  to the other breast. If your baby is not emptying your breasts completely for some reason, use a breast pump to empty your breasts. °· If you go back to work, pump your breasts while at work to stay in time with your nursing schedule. °· Avoid allowing your breasts to become overly filled with milk (engorged). °SEEK MEDICAL CARE IF: °· You have pus-like discharge from the breast. °· Your symptoms do not improve with the treatment prescribed by your health care provider within 2 days. °SEEK IMMEDIATE MEDICAL CARE IF: °· Your pain and swelling are getting worse. °· You have pain that is not controlled with medicine. °· You have a red line extending from the breast toward your armpit. °· You have a fever or persistent symptoms for more than 2-3 days. °· You have a fever and your symptoms suddenly get worse. °MAKE SURE YOU:  °· Understand these instructions. °· Will watch your condition. °· Will get help right away if you are not doing well or get worse. °Document Released: 03/13/2005 Document Revised: 11/21/2013 Document Reviewed: 06/22/2013 °[employer]® Patient Information ©2015 [employer]. This information is not intended to replace advice given to you by your health care provider. Make sure you discuss any questions you have with your health care provider. °Breastfeeding °Deciding to breastfeed is one of the best choices you can make for you and your baby. A change in hormones during pregnancy causes your breast tissue to grow and increases the number and size of your milk ducts. These hormones also allow proteins, sugars, and fats from your blood supply to make breast milk in your milk-producing glands. Hormones prevent breast milk from being released before your baby is born as well as prompt milk flow after birth. Once breastfeeding has begun, thoughts of your baby, as well as his or her sucking or crying, can stimulate the release of milk from your milk-producing glands.  °BENEFITS OF BREASTFEEDING °For Your  Baby °· Your first milk (colostrum) helps your baby's digestive system function better.   °· There are antibodies in your milk that help your baby fight off infections.   °· Your baby has a lower incidence of asthma, allergies, and sudden infant death syndrome.   °· The nutrients in breast milk are better for your baby than infant formulas and are designed uniquely for your baby's needs.   °· Breast milk improves your baby's brain development.   °· Your baby is less likely to develop other conditions, such as childhood obesity, asthma, or type 2 diabetes mellitus.   °For You  °· Breastfeeding helps to create a very special bond between you and   your baby.   °· Breastfeeding is convenient. Breast milk is always available at the correct temperature and costs nothing.   °· Breastfeeding helps to burn calories and helps you lose the weight gained during pregnancy.   °· Breastfeeding makes your uterus contract to its prepregnancy size faster and slows bleeding (lochia) after you give birth.   °· Breastfeeding helps to lower your risk of developing type 2 diabetes mellitus, osteoporosis, and breast or ovarian cancer later in life. °SIGNS THAT YOUR BABY IS HUNGRY °Early Signs of Hunger  °· Increased alertness or activity. °· Stretching. °· Movement of the head from side to side. °· Movement of the head and opening of the mouth when the corner of the mouth or cheek is stroked (rooting). °· Increased sucking sounds, smacking lips, cooing, sighing, or squeaking. °· Hand-to-mouth movements. °· Increased sucking of fingers or hands. °Late Signs of Hunger °· Fussing. °· Intermittent crying. °Extreme Signs of Hunger °Signs of extreme hunger will require calming and consoling before your baby will be able to breastfeed successfully. Do not wait for the following signs of extreme hunger to occur before you initiate breastfeeding:   °· Restlessness. °· A loud, strong cry. °·  Screaming. °BREASTFEEDING BASICS °Breastfeeding  Initiation °· Find a comfortable place to sit or lie down, with your neck and back well supported. °· Place a pillow or rolled up blanket under your baby to bring him or her to the level of your breast (if you are seated). Nursing pillows are specially designed to help support your arms and your baby while you breastfeed. °· Make sure that your baby's abdomen is facing your abdomen.   °· Gently massage your breast. With your fingertips, massage from your chest wall toward your nipple in a circular motion. This encourages milk flow. You may need to continue this action during the feeding if your milk flows slowly. °· Support your breast with 4 fingers underneath and your thumb above your nipple. Make sure your fingers are well away from your nipple and your baby's mouth.   °· Stroke your baby's lips gently with your finger or nipple.   °· When your baby's mouth is open wide enough, quickly bring your baby to your breast, placing your entire nipple and as much of the colored area around your nipple (areola) as possible into your baby's mouth.   °¨ More areola should be visible above your baby's upper lip than below the lower lip.   °¨ Your baby's tongue should be between his or her lower gum and your breast.   °· Ensure that your baby's mouth is correctly positioned around your nipple (latched). Your baby's lips should create a seal on your breast and be turned out (everted). °· It is common for your baby to suck about 2-3 minutes in order to start the flow of breast milk. °Latching °Teaching your baby how to latch on to your breast properly is very important. An improper latch can cause nipple pain and decreased milk supply for you and poor weight gain in your baby. Also, if your baby is not latched onto your nipple properly, he or she may swallow some air during feeding. This can make your baby fussy. Burping your baby when you switch breasts during the feeding can help to get rid of the air. However, teaching your  baby to latch on properly is still the best way to prevent fussiness from swallowing air while breastfeeding. °Signs that your baby has successfully latched on to your nipple:    °· Silent tugging or silent sucking, without   causing you pain.   °· Swallowing heard between every 3-4 sucks.   °·  Muscle movement above and in front of his or her ears while sucking.   °Signs that your baby has not successfully latched on to nipple:  °· Sucking sounds or smacking sounds from your baby while breastfeeding. °· Nipple pain. °If you think your baby has not latched on correctly, slip your finger into the corner of your baby's mouth to break the suction and place it between your baby's gums. Attempt breastfeeding initiation again. °Signs of Successful Breastfeeding °Signs from your baby:   °· A gradual decrease in the number of sucks or complete cessation of sucking.   °· Falling asleep.   °· Relaxation of his or her body.   °· Retention of a small amount of milk in his or her mouth.   °· Letting go of your breast by himself or herself. °Signs from you: °· Breasts that have increased in firmness, weight, and size 1-3 hours after feeding.   °· Breasts that are softer immediately after breastfeeding. °· Increased milk volume, as well as a change in milk consistency and color by the fifth day of breastfeeding.   °· Nipples that are not sore, cracked, or bleeding. °Signs That Your Baby is Getting Enough Milk °· Wetting at least 3 diapers in a 24-hour period. The urine should be clear and pale yellow by age 5 days. °· At least 3 stools in a 24-hour period by age 5 days. The stool should be soft and yellow. °· At least 3 stools in a 24-hour period by age 7 days. The stool should be seedy and yellow. °· No loss of weight greater than 10% of birth weight during the first 3 days of age. °· Average weight gain of 4-7 ounces (113-198 g) per week after age 4 days. °· Consistent daily weight gain by age 5 days, without weight loss after the  age of 2 weeks. °After a feeding, your baby may spit up a small amount. This is common. °BREASTFEEDING FREQUENCY AND DURATION °Frequent feeding will help you make more milk and can prevent sore nipples and breast engorgement. Breastfeed when you feel the need to reduce the fullness of your breasts or when your baby shows signs of hunger. This is called "breastfeeding on demand." Avoid introducing a pacifier to your baby while you are working to establish breastfeeding (the first 4-6 weeks after your baby is born). After this time you may choose to use a pacifier. Research has shown that pacifier use during the first year of a baby's life decreases the risk of sudden infant death syndrome (SIDS). °Allow your baby to feed on each breast as long as he or she wants. Breastfeed until your baby is finished feeding. When your baby unlatches or falls asleep while feeding from the first breast, offer the second breast. Because newborns are often sleepy in the first few weeks of life, you may need to awaken your baby to get him or her to feed. °Breastfeeding times will vary from baby to baby. However, the following rules can serve as a guide to help you ensure that your baby is properly fed: °· Newborns (babies 4 weeks of age or younger) may breastfeed every 1-3 hours. °· Newborns should not go longer than 3 hours during the day or 5 hours during the night without breastfeeding. °· You should breastfeed your baby a minimum of 8 times in a 24-hour period until you begin to introduce solid foods to your baby at around 6 months of   age. °BREAST MILK PUMPING °Pumping and storing breast milk allows you to ensure that your baby is exclusively fed your breast milk, even at times when you are unable to breastfeed. This is especially important if you are going back to work while you are still breastfeeding or when you are not able to be present during feedings. Your lactation consultant can give you guidelines on how long it is safe to  store breast milk.  °A breast pump is a machine that allows you to pump milk from your breast into a sterile bottle. The pumped breast milk can then be stored in a refrigerator or freezer. Some breast pumps are operated by hand, while others use electricity. Ask your lactation consultant which type will work best for you. Breast pumps can be purchased, but some hospitals and breastfeeding support groups lease breast pumps on a monthly basis. A lactation consultant can teach you how to hand express breast milk, if you prefer not to use a pump.  °CARING FOR YOUR BREASTS WHILE YOU BREASTFEED °Nipples can become dry, cracked, and sore while breastfeeding. The following recommendations can help keep your breasts moisturized and healthy: °· Avoid using soap on your nipples.   °· Wear a supportive bra. Although not required, special nursing bras and tank tops are designed to allow access to your breasts for breastfeeding without taking off your entire bra or top. Avoid wearing underwire-style bras or extremely tight bras. °· Air dry your nipples for 3-4 minutes after each feeding.   °· Use only cotton bra pads to absorb leaked breast milk. Leaking of breast milk between feedings is normal.   °· Use lanolin on your nipples after breastfeeding. Lanolin helps to maintain your skin's normal moisture barrier. If you use pure lanolin, you do not need to wash it off before feeding your baby again. Pure lanolin is not toxic to your baby. You may also hand express a few drops of breast milk and gently massage that milk into your nipples and allow the milk to air dry. °In the first few weeks after giving birth, some women experience extremely full breasts (engorgement). Engorgement can make your breasts feel heavy, warm, and tender to the touch. Engorgement peaks within 3-5 days after you give birth. The following recommendations can help ease engorgement: °· Completely empty your breasts while breastfeeding or pumping. You may want  to start by applying warm, moist heat (in the shower or with warm water-soaked hand towels) just before feeding or pumping. This increases circulation and helps the milk flow. If your baby does not completely empty your breasts while breastfeeding, pump any extra milk after he or she is finished. °· Wear a snug bra (nursing or regular) or tank top for 1-2 days to signal your body to slightly decrease milk production. °· Apply ice packs to your breasts, unless this is too uncomfortable for you. °· Make sure that your baby is latched on and positioned properly while breastfeeding. °If engorgement persists after 48 hours of following these recommendations, contact your health care provider or a lactation consultant. °OVERALL HEALTH CARE RECOMMENDATIONS WHILE BREASTFEEDING °· Eat healthy foods. Alternate between meals and snacks, eating 3 of each per day. Because what you eat affects your breast milk, some of the foods may make your baby more irritable than usual. Avoid eating these foods if you are sure that they are negatively affecting your baby. °· Drink milk, fruit juice, and water to satisfy your thirst (about 10 glasses a day).   °· Rest often, relax,   and continue to take your prenatal vitamins to prevent fatigue, stress, and anemia. °· Continue breast self-awareness checks. °· Avoid chewing and smoking tobacco. °· Avoid alcohol and drug use. °Some medicines that may be harmful to your baby can pass through breast milk. It is important to ask your health care provider before taking any medicine, including all over-the-counter and prescription medicine as well as vitamin and herbal supplements. °It is possible to become pregnant while breastfeeding. If birth control is desired, ask your health care provider about options that will be safe for your baby. °SEEK MEDICAL CARE IF:  °· You feel like you want to stop breastfeeding or have become frustrated with breastfeeding. °· You have painful breasts or  nipples. °· Your nipples are cracked or bleeding. °· Your breasts are red, tender, or warm. °· You have a swollen area on either breast. °· You have a fever or chills. °· You have nausea or vomiting. °· You have drainage other than breast milk from your nipples. °· Your breasts do not become full before feedings by the fifth day after you give birth. °· You feel sad and depressed. °· Your baby is too sleepy to eat well. °· Your baby is having trouble sleeping.   °· Your baby is wetting less than 3 diapers in a 24-hour period. °· Your baby has less than 3 stools in a 24-hour period. °· Your baby's skin or the white part of his or her eyes becomes yellow.   °· Your baby is not gaining weight by 5 days of age. °SEEK IMMEDIATE MEDICAL CARE IF:  °· Your baby is overly tired (lethargic) and does not want to wake up and feed. °· Your baby develops an unexplained fever. °Document Released: 11/16/2005 Document Revised: 11/21/2013 Document Reviewed: 05/10/2013 °[employer]® Patient Information ©2015 [employer]. This information is not intended to replace advice given to you by your health care provider. Make sure you discuss any questions you have with your health care provider. ° °

## 2015-09-02 NOTE — Progress Notes (Signed)
Patient ID: Vanessa Vanessa, female   DOB: 26-Nov-1986, 29 y.o.   MRN: [id number] Post Partum Day #2  SVD          Information for the patient's newborn:  Vanessa Vanessa, Vanessa Vanessa [[id number]]  female   / circumcision done Feeding: breast  Subjective: No HA, SOB, CP, F/C, breast symptoms. Pain well-controlled with ibuprofen. Normal vaginal bleeding, no clots.      Objective:  Temp:  [97.8 F (36.6 C)-98.5 F (36.9 C)] 97.8 F (36.6 C) (10/03 0600) Pulse Rate:  [75-83] 75 (10/03 0600) Resp:  [18-20] 18 (10/03 0600) BP: (112-128)/(73-81) 112/73 mmHg (10/03 0600)    Recent Labs  08/31/15 0852 09/01/15 0548  WBC 8.9 10.9*  HGB 10.4* 9.2*  HCT 31.6* 27.8*  PLT 211 183    Blood type: --/--/A POS (10/01 7829) Rubella: Nonimmune (03/04 0000)    Physical Exam:  General: alert, cooperative and no distress Uterine Fundus: firm, U-1 Lochia: appropriate Perineum: 1st degree repair healing well, edema none DVT Evaluation: No evidence of DVT seen on physical exam. Negative Homan's sign. No cords or calf tenderness. No significant calf/ankle edema.  Assessment/Plan: PPD # 2 / 29 y.o., [id number] S/P: induced vaginal   Principal Problem:   Postpartum care following vaginal delivery (10/1) Active Problems:   Indication for care in labor or delivery   Normal postpartum exam  Continue current postpartum care  D/C home   LOS: 2 days   Raelyn Mora, M, MSN, CNM 09/02/2015, 9:16 AM

## 2015-09-02 NOTE — Lactation Note (Signed)
This note was copied from the chart of Vanessa Mercer. Lactation Consultation Note  Patient Name: Vanessa Mercer [id number] Date: 09/02/2015 Reason for consult: Follow-up assessment Baby 41 hours old. Mom reports that she is more comfortable pumping and bottle-feeding EBM. Mom states that she had a great supply with first baby, and is just supplementing with formula now until her milk "comes in."  Mom has personal pump. Discussed normal progression of milk coming to volume for a second child. Mom aware of OP/BFSG and LC phone line assistance after D/C.   Maternal Data    Feeding Feeding Type: Breast Milk with Formula added Nipple Type: Slow - flow  LATCH Score/Interventions                      Lactation Tools Discussed/Used     Consult Status Consult Status: Complete    [name] 09/02/2015, 10:09 AM

## 2016-07-05 ENCOUNTER — Emergency Department (HOSPITAL_COMMUNITY)
Admission: EM | Admit: 2016-07-05 | Discharge: 2016-07-05 | Disposition: A | Discharge status: Home / Self Care | Payer: Managed Care, Other (non HMO) | Attending: Emergency Medicine | Admitting: Emergency Medicine

## 2016-07-05 ENCOUNTER — Encounter (HOSPITAL_COMMUNITY): Payer: Self-pay | Admitting: Emergency Medicine

## 2016-07-05 ENCOUNTER — Emergency Department (HOSPITAL_COMMUNITY): Payer: Managed Care, Other (non HMO)

## 2016-07-05 DIAGNOSIS — J40 Bronchitis, not specified as acute or chronic: Secondary | ICD-10-CM | POA: Insufficient documentation

## 2016-07-05 DIAGNOSIS — R05 Cough: Secondary | ICD-10-CM | POA: Diagnosis present

## 2016-07-05 DIAGNOSIS — Z87891 Personal history of nicotine dependence: Secondary | ICD-10-CM | POA: Insufficient documentation

## 2016-07-05 DIAGNOSIS — Z79899 Other long term (current) drug therapy: Secondary | ICD-10-CM | POA: Insufficient documentation

## 2016-07-05 MED ORDER — ALBUTEROL SULFATE (2.5 MG/3ML) 0.083% IN NEBU
2.5000 mg | INHALATION_SOLUTION | Freq: Once | RESPIRATORY_TRACT | Status: AC
  Administered 2016-07-05: 2.5 mg via RESPIRATORY_TRACT
  Filled 2016-07-05: qty 3

## 2016-07-05 MED ORDER — AEROCHAMBER PLUS FLO-VU MEDIUM MISC
1.0000 | Freq: Once | Status: AC
  Administered 2016-07-05: 1
  Filled 2016-07-05: qty 1

## 2016-07-05 MED ORDER — IPRATROPIUM-ALBUTEROL 0.5-2.5 (3) MG/3ML IN SOLN
3.0000 mL | Freq: Once | RESPIRATORY_TRACT | Status: AC
  Administered 2016-07-05: 3 mL via RESPIRATORY_TRACT
  Filled 2016-07-05: qty 3

## 2016-07-05 NOTE — ED Notes (Signed)
Pt made aware to return if symptoms worsen or if any life threatening symptoms occur.   

## 2016-07-05 NOTE — ED Notes (Signed)
Pt given aerochamber by respiratory.

## 2016-07-05 NOTE — ED Notes (Signed)
Notified respiratory about breathing tx.

## 2016-07-05 NOTE — ED Provider Notes (Signed)
[facility]-EMER[facility]ENCY DEPT Provider Note   CSN: [id number] Arrival date & time: 07/05/16  6213  First Provider Contact:  First MD Initiated Contact with Patient 07/05/16 [phone]        History   Chief Complaint Chief Complaint  Patient presents with  . Cough    HPI Vanessa Mercer is a 30 y.o. female.  HPI   Vanessa Mercer is a 30 y.o. female who presents to the Emergency Department complaining of cough, nasal congestion, ear "fullness" and shortness of breath.  Symptoms present for one week.  She states that she was seen by her PMD three days ago and diagnosed with bronchitis.  She was given zithromax, hydromet syrup, prednisone and an inhaler.  She complains of mostly dry, but occasionally productive cough that is worse with exertion and talking.  This morning she states she woke up and felt as though she wasn't able to get her breath despite using her inhaler.  She also reports intermittent fever and mild sore throat that she contributes to the cough. She denies chest pain, swelling of the extremities, or shortness of breath at rest.  She admits to not using her inhaler as frequently as it was prescribed because of "jitters"  Past Medical History:  Diagnosis Date  . Back pain   . Bleeding in early pregnancy 01/17/2015  . Depression    zoloft  . Fibromyalgia   . Headache(784.0)   . Hemorrhoids without complication   . High serum creatinine   . History of chicken pox   . Keratosis, seborrheic   . Kidney stones   . Maternal anemia, with delivery 01/20/2012  . Other nonspecific findings on examination of blood(790.99)    elevated serum creatinine  . PCOS (polycystic ovarian syndrome)   . PP care - s/p SVD 2/19 01/20/2012  . Subchorionic hemorrhage in first trimester 01/17/2015    Patient Active Problem List   Diagnosis Date Noted  . Indication for care in labor or delivery 08/31/2015  . Postpartum care following vaginal delivery (10/1) 08/31/2015  . Subchorionic hemorrhage in first  trimester 01/17/2015  . Bleeding in early pregnancy 01/17/2015  . Migraine headache 11/24/2012  . History of depression 11/24/2012    Past Surgical History:  Procedure Laterality Date  . KNEE SURGERY    . WISDOM TOOTH EXTRACTION      OB History    Gravida Para Term Preterm AB Living   2 2 2  0 0 2   SAB TAB Ectopic Multiple Live Births   0 0 0 0 2       Home Medications    Prior to Admission medications   Medication Sig Start Date End Date Taking? Authorizing Provider  azithromycin (ZITHROMAX) 250 MG tablet 2 tabs on the first day, then 1 tab daily Patient not taking: Reported on 08/31/2015 07/23/15   Terressa Koyanagi, DO  busPIRone (BUSPAR) 15 MG tablet Take 15 mg by mouth daily.     Historical Provider, MD  cyclobenzaprine (FLEXERIL) 10 MG tablet Take 10 mg by mouth at bedtime.  05/02/15   Historical Provider, MD  fluticasone (FLOVENT HFA) 44 MCG/ACT inhaler Inhale 2 puffs into the lungs 2 (two) times daily. For 5-7 days Patient not taking: Reported on 08/31/2015 07/23/15   Terressa Koyanagi, DO  ibuprofen (ADVIL,MOTRIN) 600 MG tablet Take 1 tablet (600 mg total) by mouth every 6 (six) hours. 09/02/15   Raelyn Mora, CNM  iron polysaccharides (NIFEREX) 150 MG capsule Take 1 capsule (150  mg total) by mouth daily. 09/02/15   Raelyn Moraolitta Dawson, CNM  MAGNESIUM PO Take 1 tablet by mouth daily.    Historical Provider, MD  Prenatal Vit-Fe Fumarate-FA (PRENATAL MULTIVITAMIN) TABS Take 1 tablet by mouth daily at 12 noon.    Historical Provider, MD  pyridOXINE (VITAMIN B-6) 100 MG tablet Take 100 mg by mouth daily.    Historical Provider, MD  riboflavin (VITAMIN B-2) 100 MG TABS tablet Take 400 mg by mouth daily.     Historical Provider, MD  sertraline (ZOLOFT) 100 MG tablet Take 150 mg by mouth daily.  02/26/15   Historical Provider, MD    Family History Family History  Problem Relation Age of Onset  . Heart disease Maternal Grandmother   . Diabetes Maternal Grandfather   . Heart disease Maternal  Grandfather   . Heart attack Maternal Grandfather   . Diabetes Paternal Grandfather   . Cancer Paternal Grandfather     prostate  . Urolithiasis Mother   . Migraines Mother   . Migraines Sister   . Arthritis Sister     psoriatic arthritis    Social History Social History  Substance Use Topics  . Smoking status: Former Smoker    Packs/day: 0.50    Years: 2.00    Types: Cigarettes    Quit date: 05/11/2011  . Smokeless tobacco: Never Used  . Alcohol use No     Allergies   Review of patient's allergies indicates no known allergies.   Review of Systems Review of Systems  Constitutional: Negative for appetite change, chills and fever.  HENT: Positive for congestion. Negative for sore throat and trouble swallowing.   Respiratory: Positive for cough, chest tightness and shortness of breath. Negative for wheezing.   Cardiovascular: Negative for chest pain.  Gastrointestinal: Negative for abdominal pain, nausea and vomiting.  Genitourinary: Negative for dysuria.  Musculoskeletal: Negative for arthralgias.  Skin: Negative for rash.  Neurological: Negative for dizziness, weakness and numbness.  Hematological: Negative for adenopathy.  All other systems reviewed and are negative.    Physical Exam Updated Vital Signs BP 129/98 (BP Location: Right Arm)   Pulse 84   Temp 98.1 F (36.7 C) (Oral)   Resp 18   Ht 5\' 3"  (1.6 m)   Wt 88.5 kg   LMP 06/14/2016   SpO2 97%   BMI 34.54 kg/m   Physical Exam  Constitutional: She is oriented to person, place, and time. She appears well-developed and well-nourished. No distress.  HENT:  Head: Normocephalic and atraumatic.  Right Ear: Tympanic membrane and ear canal normal.  Left Ear: Tympanic membrane and ear canal normal.  Mouth/Throat: Uvula is midline, oropharynx is clear and moist and mucous membranes are normal. No oropharyngeal exudate.  Eyes: EOM are normal. Pupils are equal, round, and reactive to light.  Neck: Normal range  of motion, full passive range of motion without pain and phonation normal. Neck supple.  Cardiovascular: Normal rate, regular rhythm and intact distal pulses.   No murmur heard. Pulmonary/Chest: Effort normal. No stridor. No respiratory distress. She has no wheezes. She has no rales. She exhibits no tenderness.  Slightly diminished lung sounds bilaterally.  No rales  Abdominal: Soft. She exhibits no distension. There is no tenderness.  Musculoskeletal: Normal range of motion. She exhibits no edema.  Lymphadenopathy:    She has no cervical adenopathy.  Neurological: She is alert and oriented to person, place, and time. She exhibits normal muscle tone. Coordination normal.  Skin: Skin is warm and  dry.  Psychiatric: She has a normal mood and affect.  Nursing note and vitals reviewed.    ED Treatments / Results  Labs (all labs ordered are listed, but only abnormal results are displayed) Labs Reviewed - No data to display  EKG  EKG Interpretation None       Radiology Dg Chest 2 View  Result Date: 07/05/2016 CLINICAL DATA:  Cough all week.  Fever. EXAM: CHEST  2 VIEW COMPARISON:  June 02, 2013 FINDINGS: The heart size and mediastinal contours are within normal limits. Both lungs are clear. The visualized skeletal structures are unremarkable. IMPRESSION: No active cardiopulmonary disease. Electronically Signed   By: Gerome Sam III M.D   On: 07/05/2016 09:28    Procedures Procedures (including critical care time)  Medications Ordered in ED Medications - No data to display   Initial Impression / Assessment and Plan / ED Course  I have reviewed the triage vital signs and the nursing notes.  Pertinent labs & imaging results that were available during my care of the patient were reviewed by me and considered in my medical decision making (see chart for details).  Clinical Course   Pt well appearing.  Vitals stable.  Doubt PE.  Lungs sounds improved and pt feeling better after  albuterol neb.  Plan includes to finish abx, prednisone and PMD f/u if not improving.    aerochamber dispensed to use along with her inhaler, instructions for use given.    Final Clinical Impressions(s) / ED Diagnoses   Final diagnoses:  Bronchitis    New Prescriptions New Prescriptions   No medications on file     Pauline Aus, PA-C 07/05/16 1025    Bethann Berkshire, MD 07/05/16 1344

## 2016-07-05 NOTE — ED Triage Notes (Signed)
Patient c/o productive cough x2 weeks. Per patient seen PCP on Thursday and diagnosed with Bronchitis. Patient given Hydromet Syrup, Prednisone, azithromycin, and proair inhaler. Patient reports some relief in cough but states woke this morning with shortness of breath. Patient states "I set in front of a fan this morning because it was like I couldn't catch my breath. Patient reports thick brown sputum with cough. Per patient ear pain, sore throat from coughing, and intermittent fever.

## 2016-07-05 NOTE — Progress Notes (Signed)
Patient instructed on the use of her Albuterol MDI with spacer. Patient voiced her understanding. BBS noted to be clear.

## 2016-07-05 NOTE — Discharge Instructions (Signed)
Continue your current medications as directed.  Use the spacer on your inhaler.  Follow-up with your doctor for recheck

## 2016-09-19 ENCOUNTER — Encounter (HOSPITAL_COMMUNITY): Payer: Self-pay

## 2016-09-19 ENCOUNTER — Emergency Department (HOSPITAL_COMMUNITY)
Admission: EM | Admit: 2016-09-19 | Discharge: 2016-09-19 | Disposition: A | Discharge status: Home / Self Care | Payer: Managed Care, Other (non HMO) | Attending: Emergency Medicine | Admitting: Emergency Medicine

## 2016-09-19 DIAGNOSIS — G43909 Migraine, unspecified, not intractable, without status migrainosus: Secondary | ICD-10-CM | POA: Diagnosis not present

## 2016-09-19 DIAGNOSIS — Z79899 Other long term (current) drug therapy: Secondary | ICD-10-CM | POA: Insufficient documentation

## 2016-09-19 DIAGNOSIS — G43009 Migraine without aura, not intractable, without status migrainosus: Secondary | ICD-10-CM

## 2016-09-19 DIAGNOSIS — Z87891 Personal history of nicotine dependence: Secondary | ICD-10-CM | POA: Diagnosis not present

## 2016-09-19 DIAGNOSIS — R51 Headache: Secondary | ICD-10-CM | POA: Diagnosis present

## 2016-09-19 MED ORDER — DEXAMETHASONE SODIUM PHOSPHATE 10 MG/ML IJ SOLN
10.0000 mg | Freq: Once | INTRAMUSCULAR | Status: AC
  Administered 2016-09-19: 10 mg via INTRAVENOUS
  Filled 2016-09-19: qty 1

## 2016-09-19 MED ORDER — SODIUM CHLORIDE 0.9 % IV BOLUS (SEPSIS)
1000.0000 mL | Freq: Once | INTRAVENOUS | Status: AC
  Administered 2016-09-19: 1000 mL via INTRAVENOUS

## 2016-09-19 MED ORDER — PROCHLORPERAZINE EDISYLATE 5 MG/ML IJ SOLN
10.0000 mg | Freq: Once | INTRAMUSCULAR | Status: AC
  Administered 2016-09-19: 10 mg via INTRAVENOUS
  Filled 2016-09-19: qty 2

## 2016-09-19 MED ORDER — METOCLOPRAMIDE HCL 5 MG/ML IJ SOLN
10.0000 mg | Freq: Once | INTRAMUSCULAR | Status: AC
  Administered 2016-09-19: 10 mg via INTRAVENOUS

## 2016-09-19 MED ORDER — MAGNESIUM SULFATE 2 GM/50ML IV SOLN
2.0000 g | Freq: Once | INTRAVENOUS | Status: AC
  Administered 2016-09-19: 2 g via INTRAVENOUS
  Filled 2016-09-19: qty 50

## 2016-09-19 MED ORDER — METOCLOPRAMIDE HCL 5 MG/ML IJ SOLN
INTRAMUSCULAR | Status: AC
  Filled 2016-09-19: qty 2

## 2016-09-19 MED ORDER — DIPHENHYDRAMINE HCL 50 MG/ML IJ SOLN
25.0000 mg | Freq: Once | INTRAMUSCULAR | Status: AC
  Administered 2016-09-19: 25 mg via INTRAVENOUS
  Filled 2016-09-19: qty 1

## 2016-09-19 NOTE — ED Provider Notes (Signed)
[facility] DEPT Provider Note   CSN: [id number] Arrival date & time: 09/19/16  0213     History   Chief Complaint Chief Complaint  Patient presents with  . Headache    HPI Vanessa Mercer is a 30 y.o. female.  She has a history of headaches. She complains of a left hemicranial headache which started 3 days ago and is now spread across the occiput. Pain is pounding and worse with exposure to light. There is associated nausea and vomiting. She denies visual changes. She denies fever or chills denies stiff neck. She did see her headache specialist who gave her metoclopramide without any benefit. She is taking Imitrex without any benefit.   The history is provided by the patient.  Headache      Past Medical History:  Diagnosis Date  . Back pain   . Bleeding in early pregnancy 01/17/2015  . Depression    zoloft  . Fibromyalgia   . Headache(784.0)   . Hemorrhoids without complication   . High serum creatinine   . History of chicken pox   . Keratosis, seborrheic   . Kidney stones   . Maternal anemia, with delivery 01/20/2012  . Other nonspecific findings on examination of blood(790.99)    elevated serum creatinine  . PCOS (polycystic ovarian syndrome)   . PP care - s/p SVD 2/19 01/20/2012  . Subchorionic hemorrhage in first trimester 01/17/2015    Patient Active Problem List   Diagnosis Date Noted  . Indication for care in labor or delivery 08/31/2015  . Postpartum care following vaginal delivery (10/1) 08/31/2015  . Subchorionic hemorrhage in first trimester 01/17/2015  . Bleeding in early pregnancy 01/17/2015  . Migraine headache 11/24/2012  . History of depression 11/24/2012    Past Surgical History:  Procedure Laterality Date  . KNEE SURGERY    . WISDOM TOOTH EXTRACTION      OB History    Gravida Para Term Preterm AB Living   2 2 2  0 0 2   SAB TAB Ectopic Multiple Live Births   0 0 0 0 2       Home Medications    Prior to Admission medications     Medication Sig Start Date End Date Taking? Authorizing Provider  busPIRone (BUSPAR) 15 MG tablet Take 15 mg by mouth daily.    Yes Historical Provider, MD  FLUoxetine (PROZAC) 20 MG capsule Take 20 mg by mouth daily.   Yes Historical Provider, MD  iron polysaccharides (NIFEREX) 150 MG capsule Take 1 capsule (150 mg total) by mouth daily. 09/02/15  Yes Raelyn Mora, CNM  metoCLOPramide (REGLAN) 10 MG tablet Take 10 mg by mouth 4 (four) times daily.   Yes Historical Provider, MD  Multiple Vitamin (MULTIVITAMIN) tablet Take 1 tablet by mouth daily.   Yes Historical Provider, MD  azithromycin (ZITHROMAX) 250 MG tablet 2 tabs on the first day, then 1 tab daily Patient not taking: Reported on 08/31/2015 07/23/15   Terressa Koyanagi, DO  baclofen (LIORESAL) 10 MG tablet Take 10 mg by mouth at bedtime.    Historical Provider, MD  fluticasone (FLOVENT HFA) 44 MCG/ACT inhaler Inhale 2 puffs into the lungs 2 (two) times daily. For 5-7 days Patient not taking: Reported on 08/31/2015 07/23/15   Terressa Koyanagi, DO  ibuprofen (ADVIL,MOTRIN) 600 MG tablet Take 1 tablet (600 mg total) by mouth every 6 (six) hours. Patient not taking: Reported on 07/05/2016 09/02/15   Raelyn Mora, CNM  MAGNESIUM PO Take 1  tablet by mouth daily.    Historical Provider, MD  riboflavin (VITAMIN B-2) 100 MG TABS tablet Take 400 mg by mouth daily.     Historical Provider, MD    Family History Family History  Problem Relation Age of Onset  . Heart disease Maternal Grandmother   . Diabetes Maternal Grandfather   . Heart disease Maternal Grandfather   . Heart attack Maternal Grandfather   . Diabetes Paternal Grandfather   . Cancer Paternal Grandfather     prostate  . Urolithiasis Mother   . Migraines Mother   . Migraines Sister   . Arthritis Sister     psoriatic arthritis    Social History Social History  Substance Use Topics  . Smoking status: Former Smoker    Packs/day: 0.50    Years: 2.00    Types: Cigarettes    Quit date:  05/11/2011  . Smokeless tobacco: Never Used  . Alcohol use No     Allergies   Review of patient's allergies indicates no known allergies.   Review of Systems Review of Systems  Neurological: Positive for headaches.  All other systems reviewed and are negative.    Physical Exam Updated Vital Signs BP (!) 180/109 (BP Location: Left Arm)   Pulse 92   Resp 20   Ht 5\' 2"  (1.575 m)   Wt 195 lb (88.5 kg)   SpO2 98%   BMI 35.67 kg/m   Physical Exam  Nursing note and vitals reviewed.  30 year old female, crying, but in no acute distress. Vital signs are significant for hypertension. Oxygen saturation is 98%, which is normal. Head is normocephalic and atraumatic. PERRLA, EOMI. Fundi show no hemorrhage, exudate, or papilledema. Oropharynx is clear. Neck is nontender and supple without adenopathy or JVD.  Back is nontender and there is no CVA tenderness. Lungs are clear without rales, wheezes, or rhonchi. Chest is nontender. Heart has regular rate and rhythm without murmur. Abdomen is soft, flat, nontender without masses or hepatosplenomegaly and peristalsis is normoactive. Extremities have no cyanosis or edema, full range of motion is present. Skin is warm and dry without rash. Neurologic: Mental status is normal, cranial nerves are intact, there are no motor or sensory deficits.  ED Treatments / Results  Labs (all labs ordered are listed, but only abnormal results are displayed) Labs Reviewed - No data to display  EKG  EKG Interpretation None       Radiology No results found.  Procedures Procedures (including critical care time)  Medications Ordered in ED Medications  sodium chloride 0.9 % bolus 1,000 mL (not administered)  diphenhydrAMINE (BENADRYL) injection 25 mg (not administered)  prochlorperazine (COMPAZINE) injection 10 mg (not administered)  dexamethasone (DECADRON) injection 10 mg (not administered)     Initial Impression / Assessment and Plan / ED  Course  I have reviewed the triage vital signs and the nursing notes.  Pertinent labs & imaging results that were available during my care of the patient were reviewed by me and considered in my medical decision making (see chart for details).  Clinical Course   Headache consistent with migraine in patient with known history of similar headaches. No red flags to suggest more serious cause of headache. Since she already failed outpatient metoclopramide, will give her a chlorpromazine, diphenhydramine, dexamethasone.  She did not get adequate relief of her headache with this treatment. She was then given intravenous metoclopramide and magnesium with excellent relief of headache And was discharged.  Final Clinical Impressions(s) / ED  Diagnoses   Final diagnoses:  Migraine without aura and without status migrainosus, not intractable    New Prescriptions New Prescriptions   No medications on file     Dione Booze, MD 09/19/16 [phone]

## 2016-09-19 NOTE — ED Notes (Signed)
MD at bedside. 

## 2016-09-19 NOTE — ED Triage Notes (Signed)
Headache for several days, seen by her pmd and started on reglan without relief.

## 2017-01-14 IMAGING — US US OB COMP LESS 14 WK
1 series · 14 of 28 positions shown · non-contrast
Comparison: None.

CLINICAL DATA: Vaginal bleeding since yesterday.

EXAM:
OBSTETRIC <14 WK US AND TRANSVAGINAL OB US
TECHNIQUE: Both transabdominal and transvaginal ultrasound examinations were
performed for complete evaluation of the gestation as well as the
maternal uterus, adnexal regions, and pelvic cul-de-sac.
Transvaginal technique was performed to assess early pregnancy.

[Series 1: us ob comp less 14 wks · 14 of 36 slices shown]
[im 2/36]
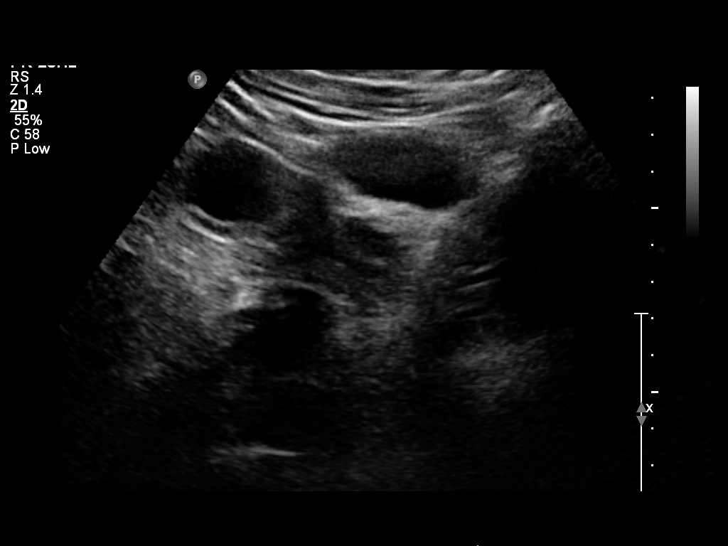
[im 4/36]
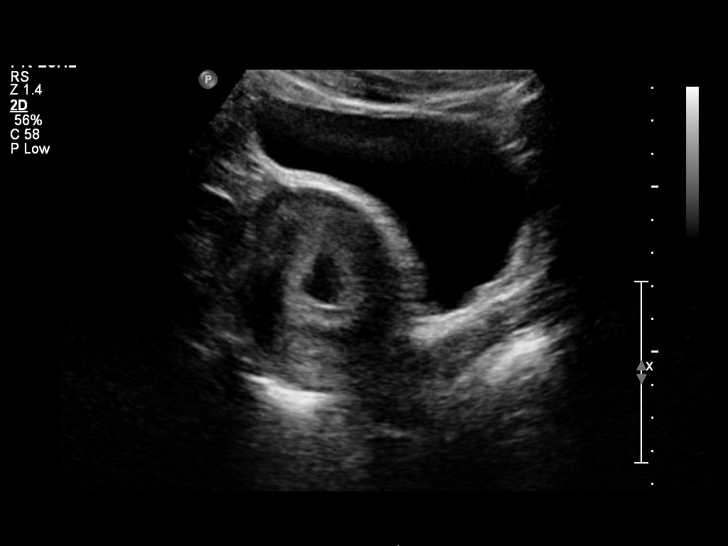
[im 7/36]
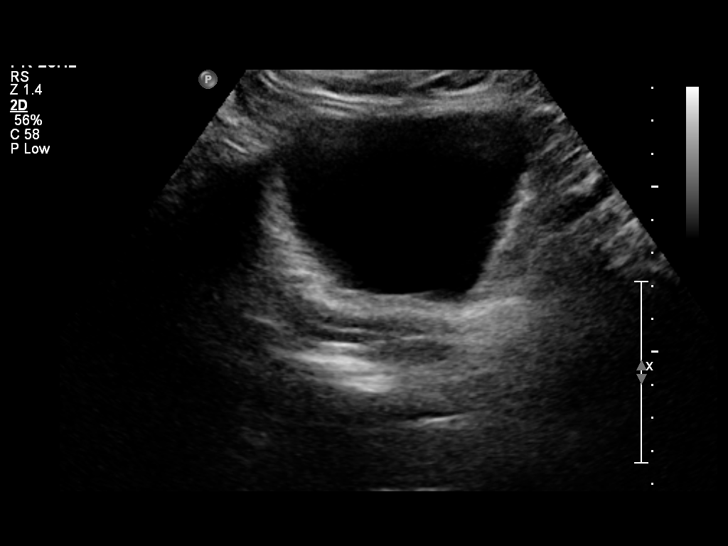
[im 10/36]
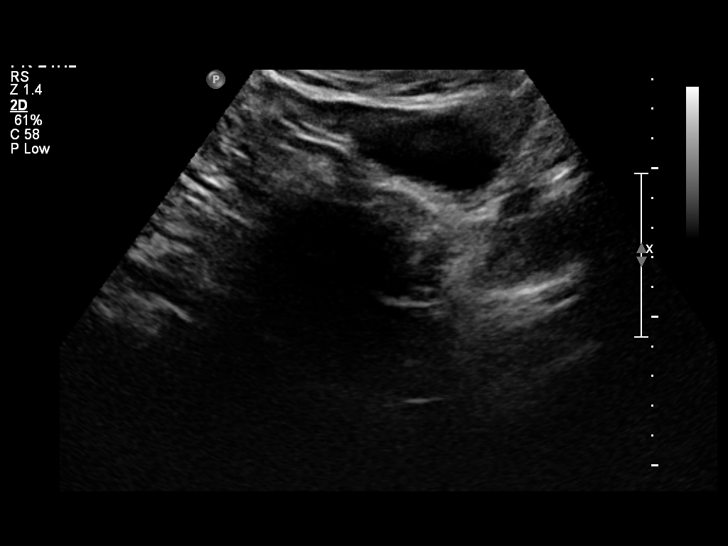
[im 12/36]
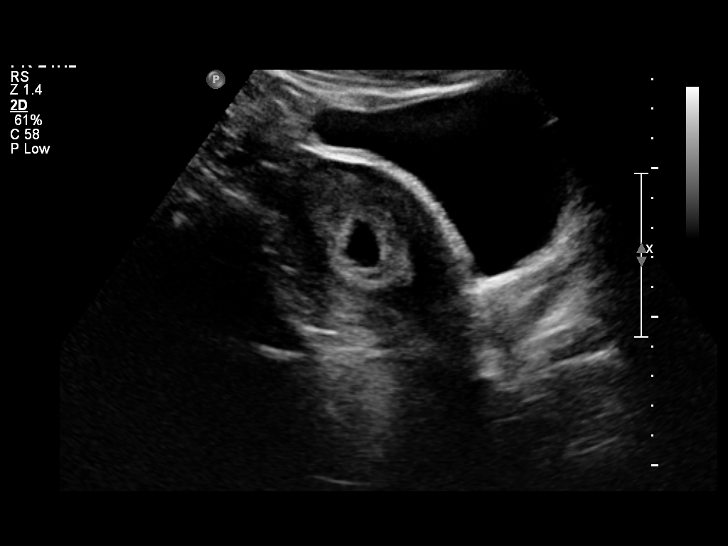
[im 15/36]
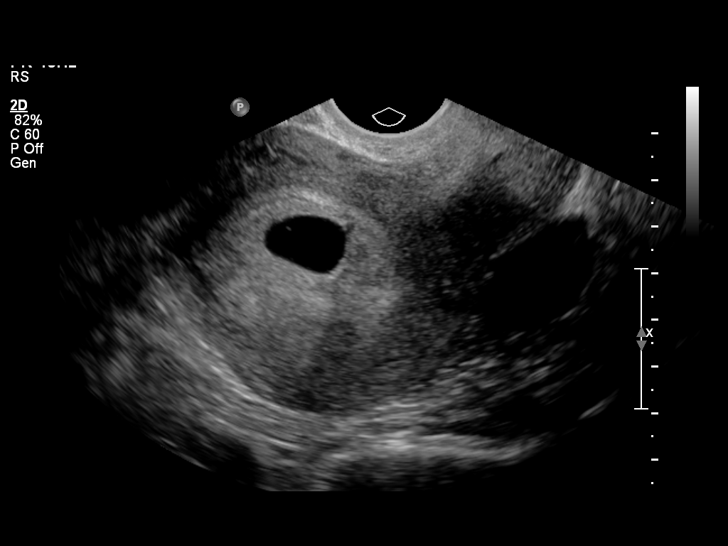
[im 17/36]
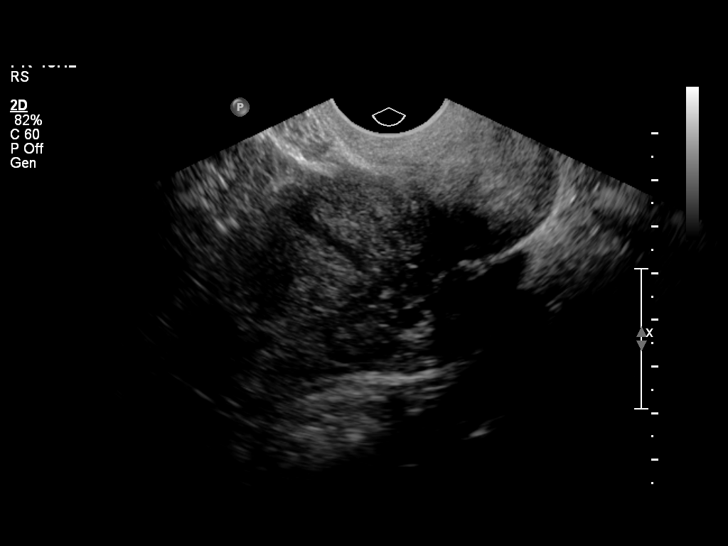
[im 20/36]
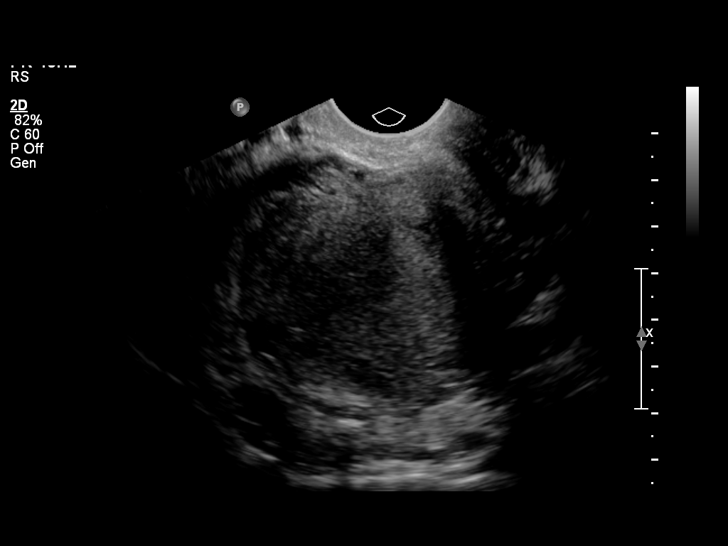
[im 23/36]
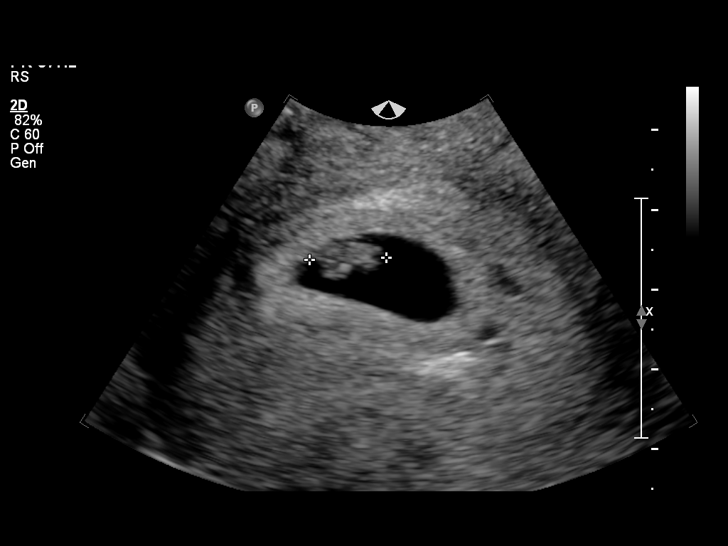
[im 25/36]
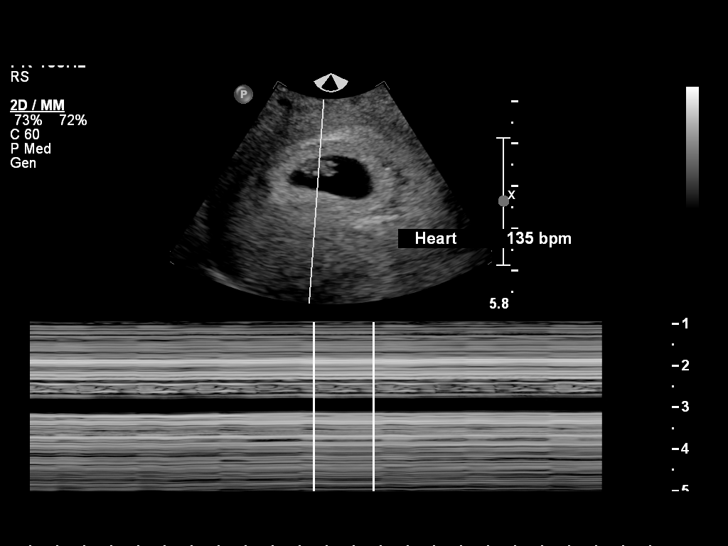
[im 28/36]
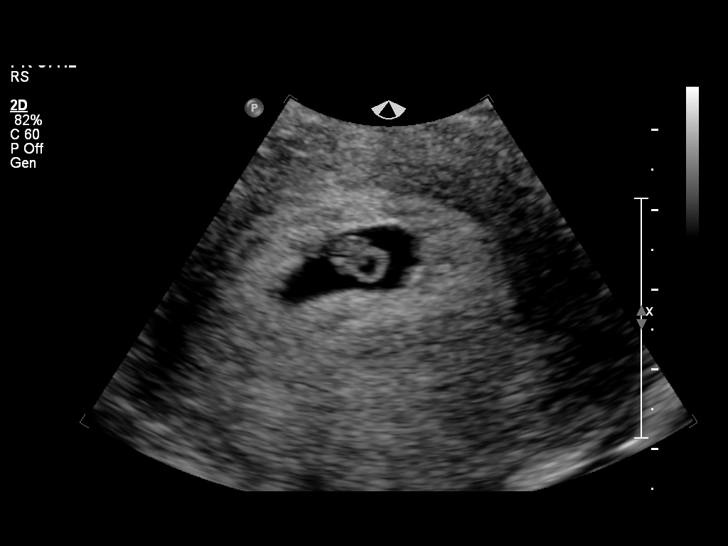
[im 30/36]
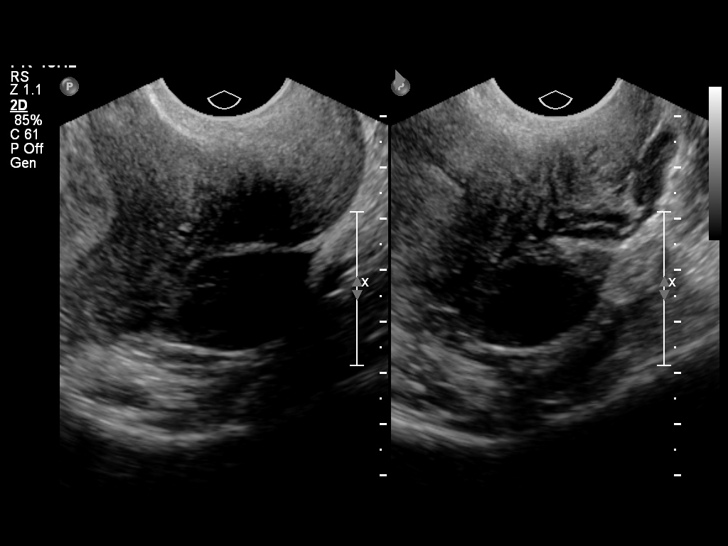
[im 33/36]
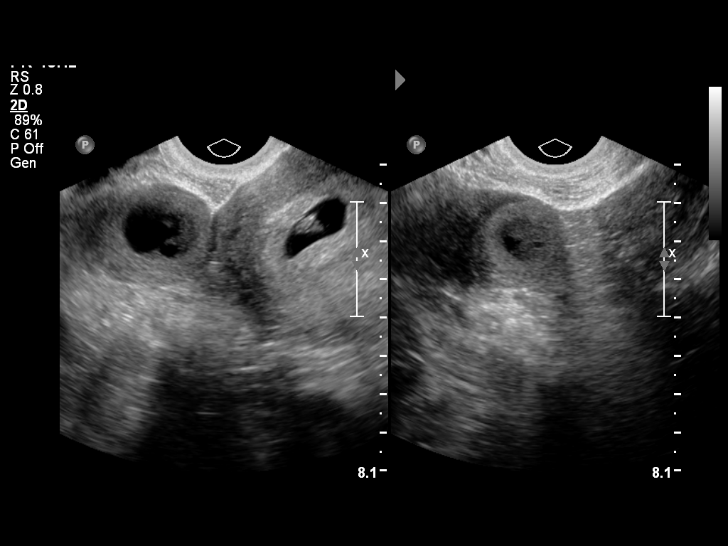
[im 36/36]
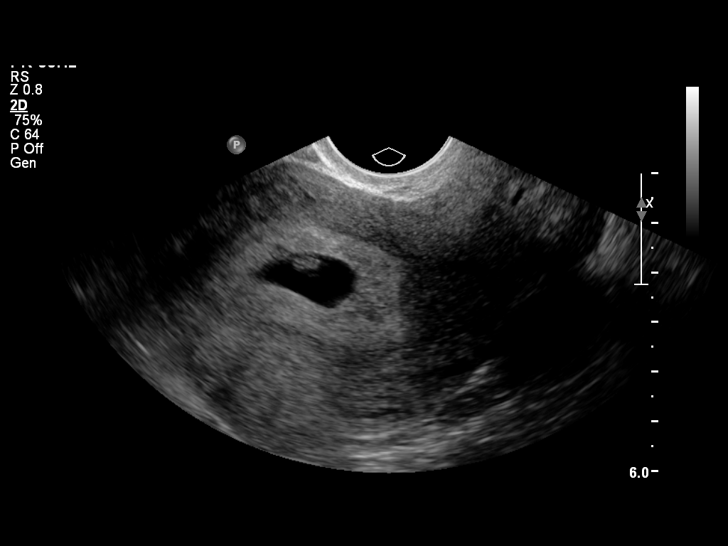

[14 of 28 positions shown; findings below may reference images not displayed]

FINDINGS: Intrauterine gestational sac: Visualized/normal in shape.

Yolk sac:  Visible

Embryo:  Visible

Cardiac Activity: Visi

Heart Rate: Ms. Nzosaba  bpm

CRL:  9.9  mm   7 w   1 d                  US EDC: 09/04/2015

Maternal uterus/adnexae: Normal
IMPRESSION: Living intrauterine gestation at 7 weeks 1 day by crown-rump length.

## 2017-06-25 IMAGING — US US RENAL
1 series · 14 of 25 positions shown · non-contrast
Comparison: Abdominal and pelvic CT scan dated February 06, 2011

CLINICAL DATA: The patient is 20 weight 8 weeks pregnant,
three-week history of hematuria and right-sided back pain, history
of urinary tract stones

EXAM:
RENAL / URINARY TRACT ULTRASOUND COMPLETE

[Series 1: us renal · 0.28mm/px · 14 of 37 slices shown]
[im 1/37]
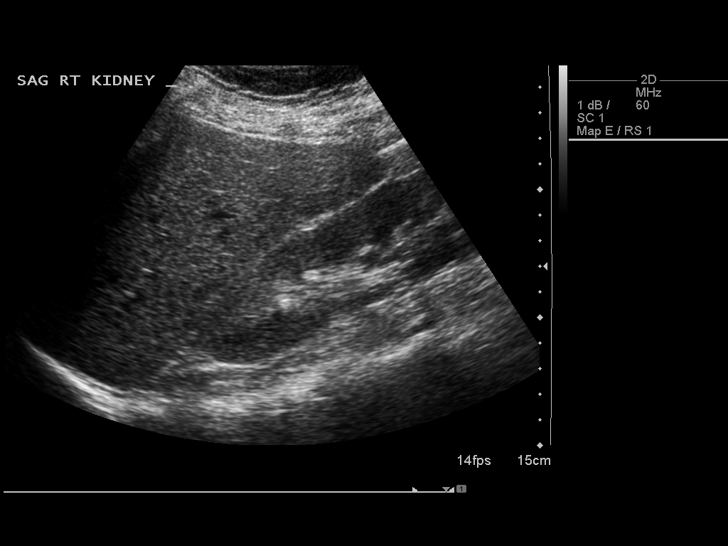
[im 4/37]
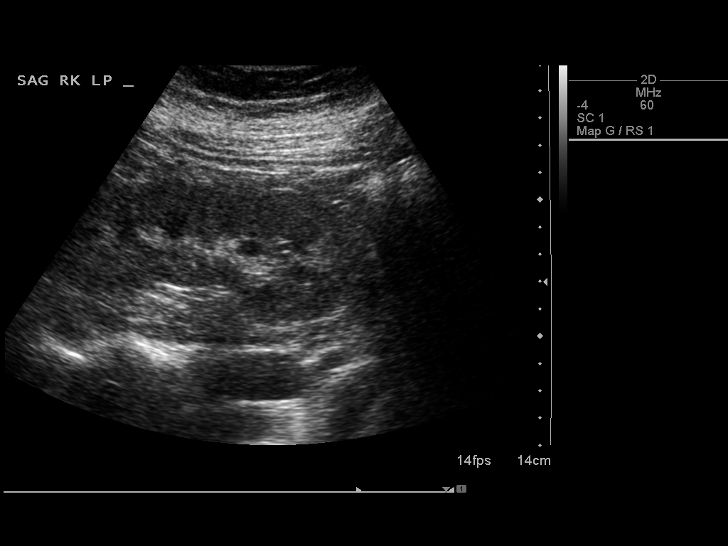
[im 7/37]
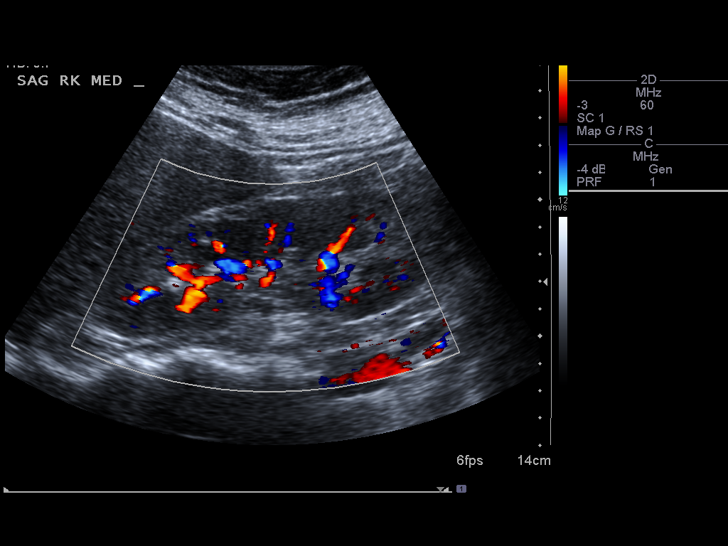
[im 10/37]
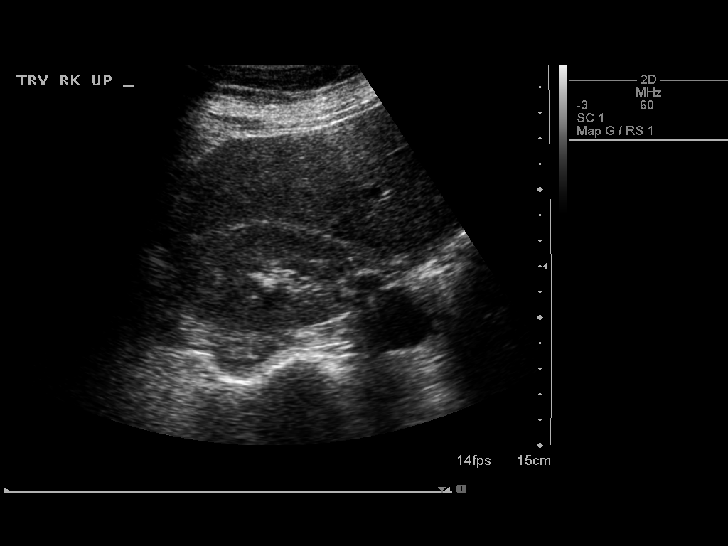
[im 13/37]
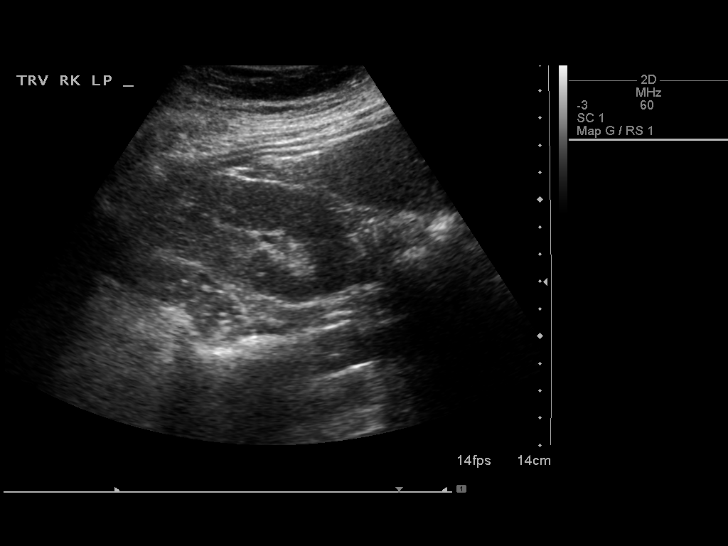
[im 14/37]
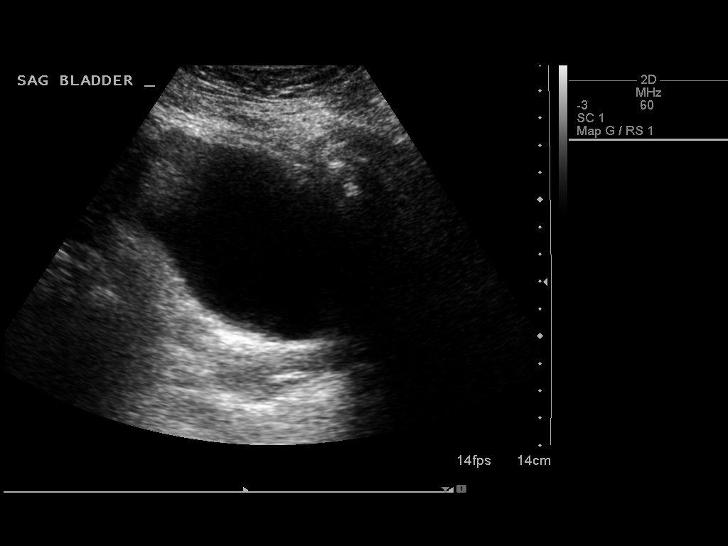
[im 17/37]
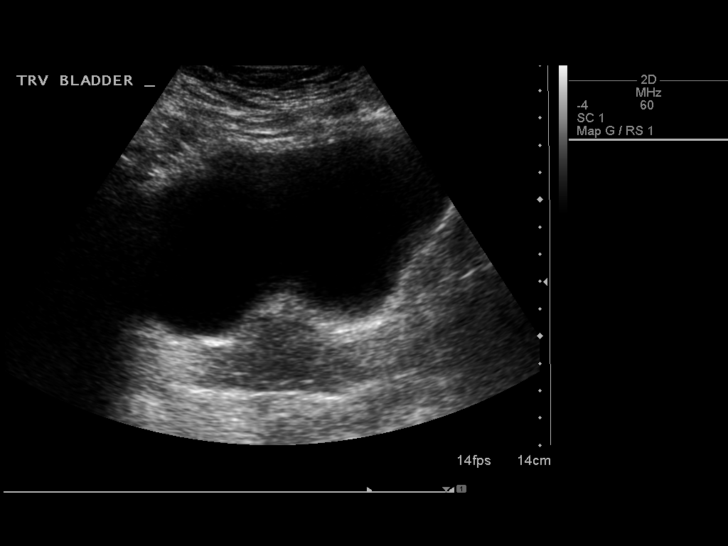
[im 20/37]
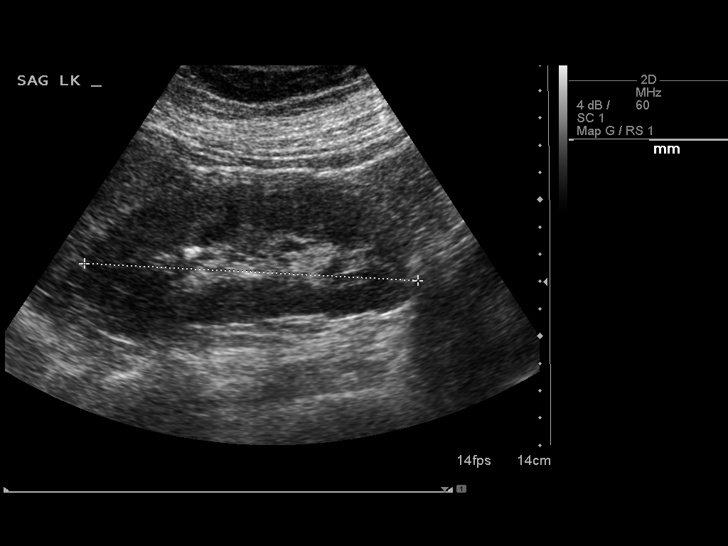
[im 23/37]
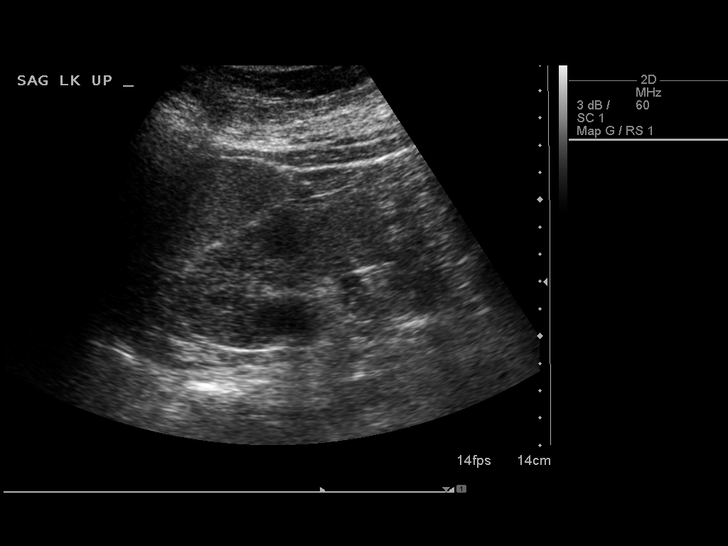
[im 25/37]
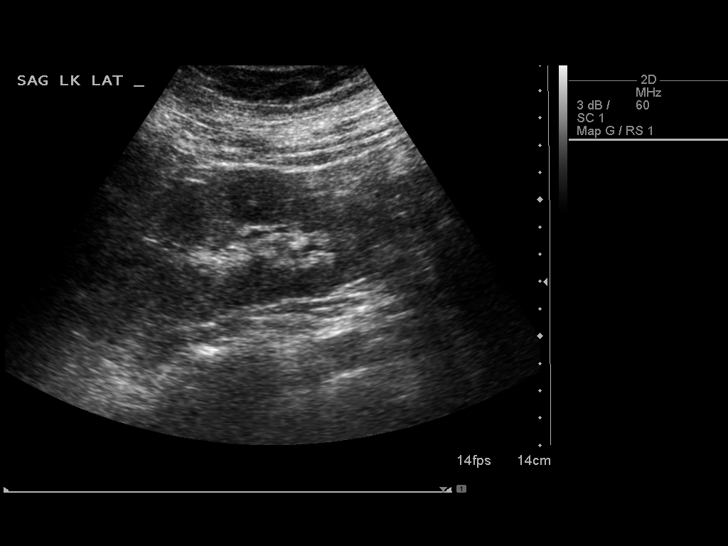
[im 28/37]
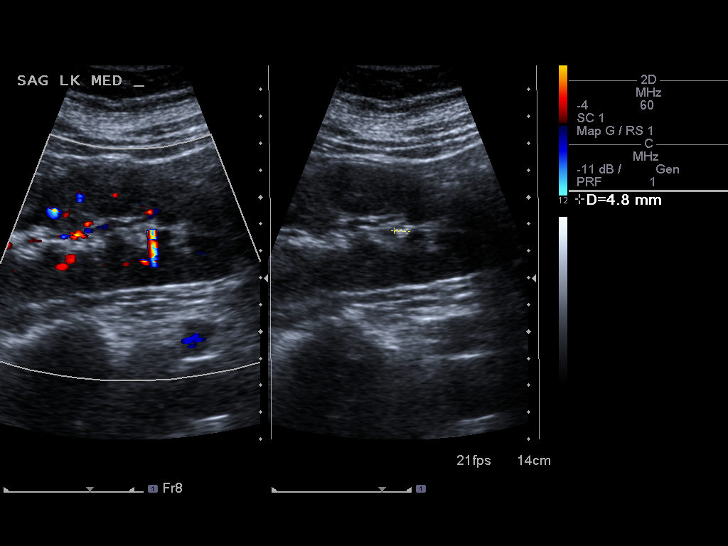
[im 31/37]
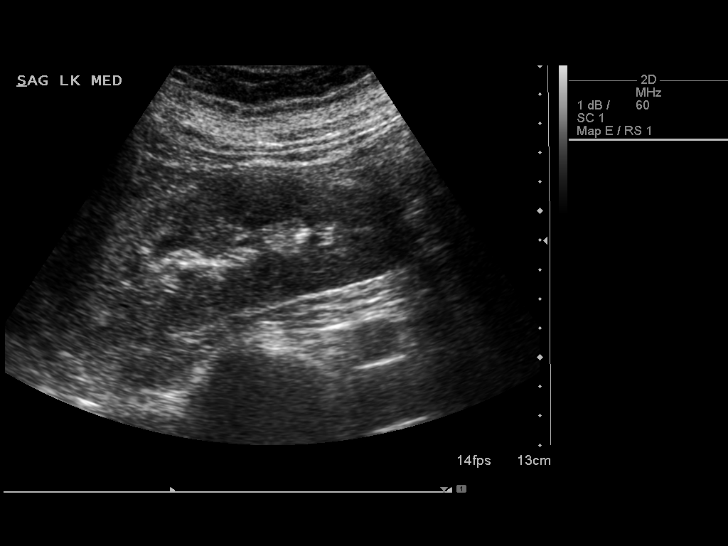
[im 34/37]
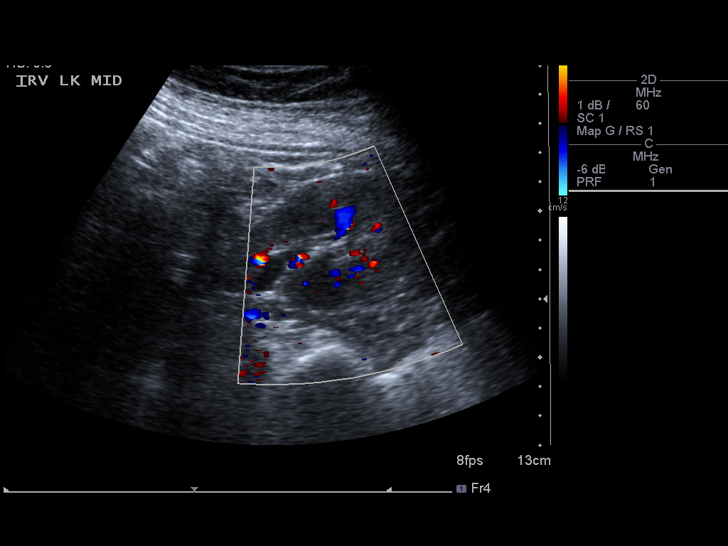
[im 37/37]
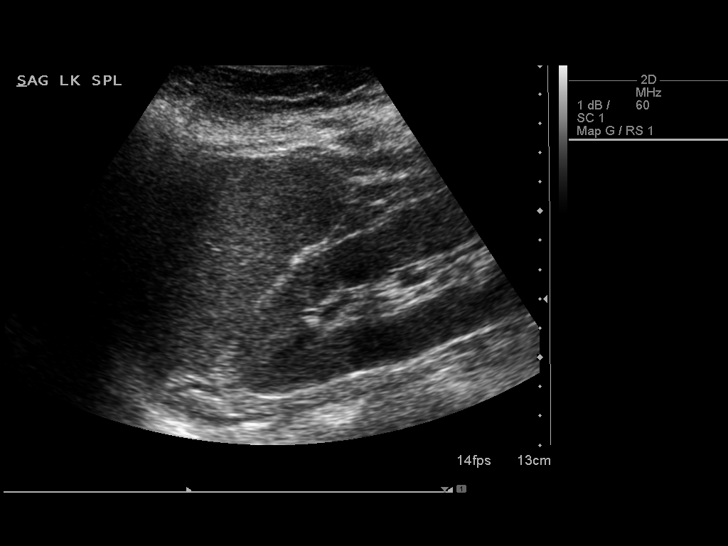

[14 of 25 positions shown; findings below may reference images not displayed]

FINDINGS: Right Kidney:

Length: 13.2 cm. Echogenicity within normal limits. No mass or
hydronephrosis visualized.

Left Kidney:

Length: 13 cm. There is mild dilation of the central echo complex
and renal pelvis. There is a 5 mm midpole stone not producing
obstruction. The renal cortical echotexture remains normal.

Bladder:

Appears normal for degree of bladder distention.
IMPRESSION: 1. Normal-appearing right kidney.
2. Mild hydronephrosis on the left. There is a 5 mm stone in the
midpole of the left kidney.

## 2018-07-03 IMAGING — DX DG CHEST 2V
2 series · 2 of 2 positions shown · non-contrast
Comparison: June 02, 2013

CLINICAL DATA: Cough all week.  Fever.

EXAM:
CHEST  2 VIEW

[chest pa]
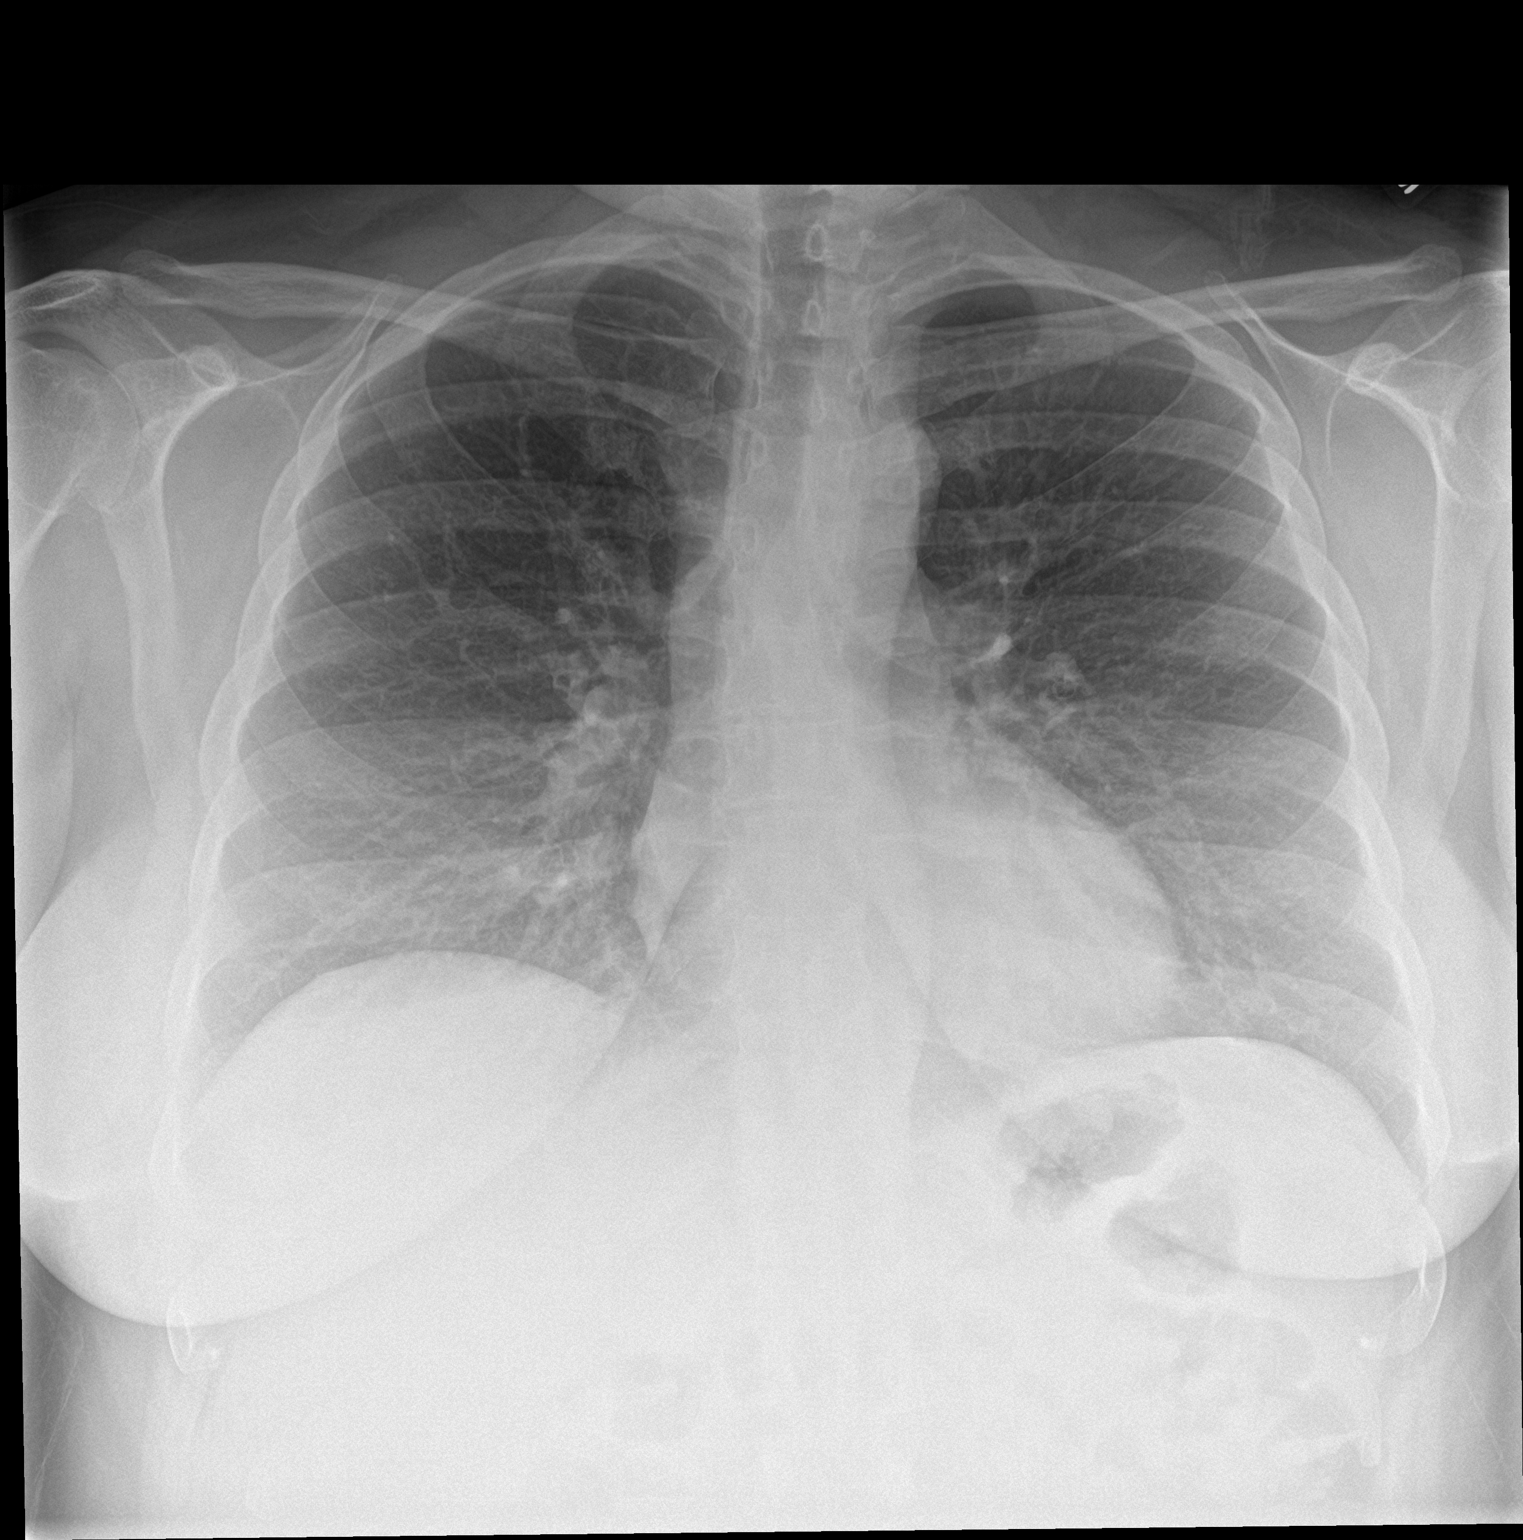

[chest lat]
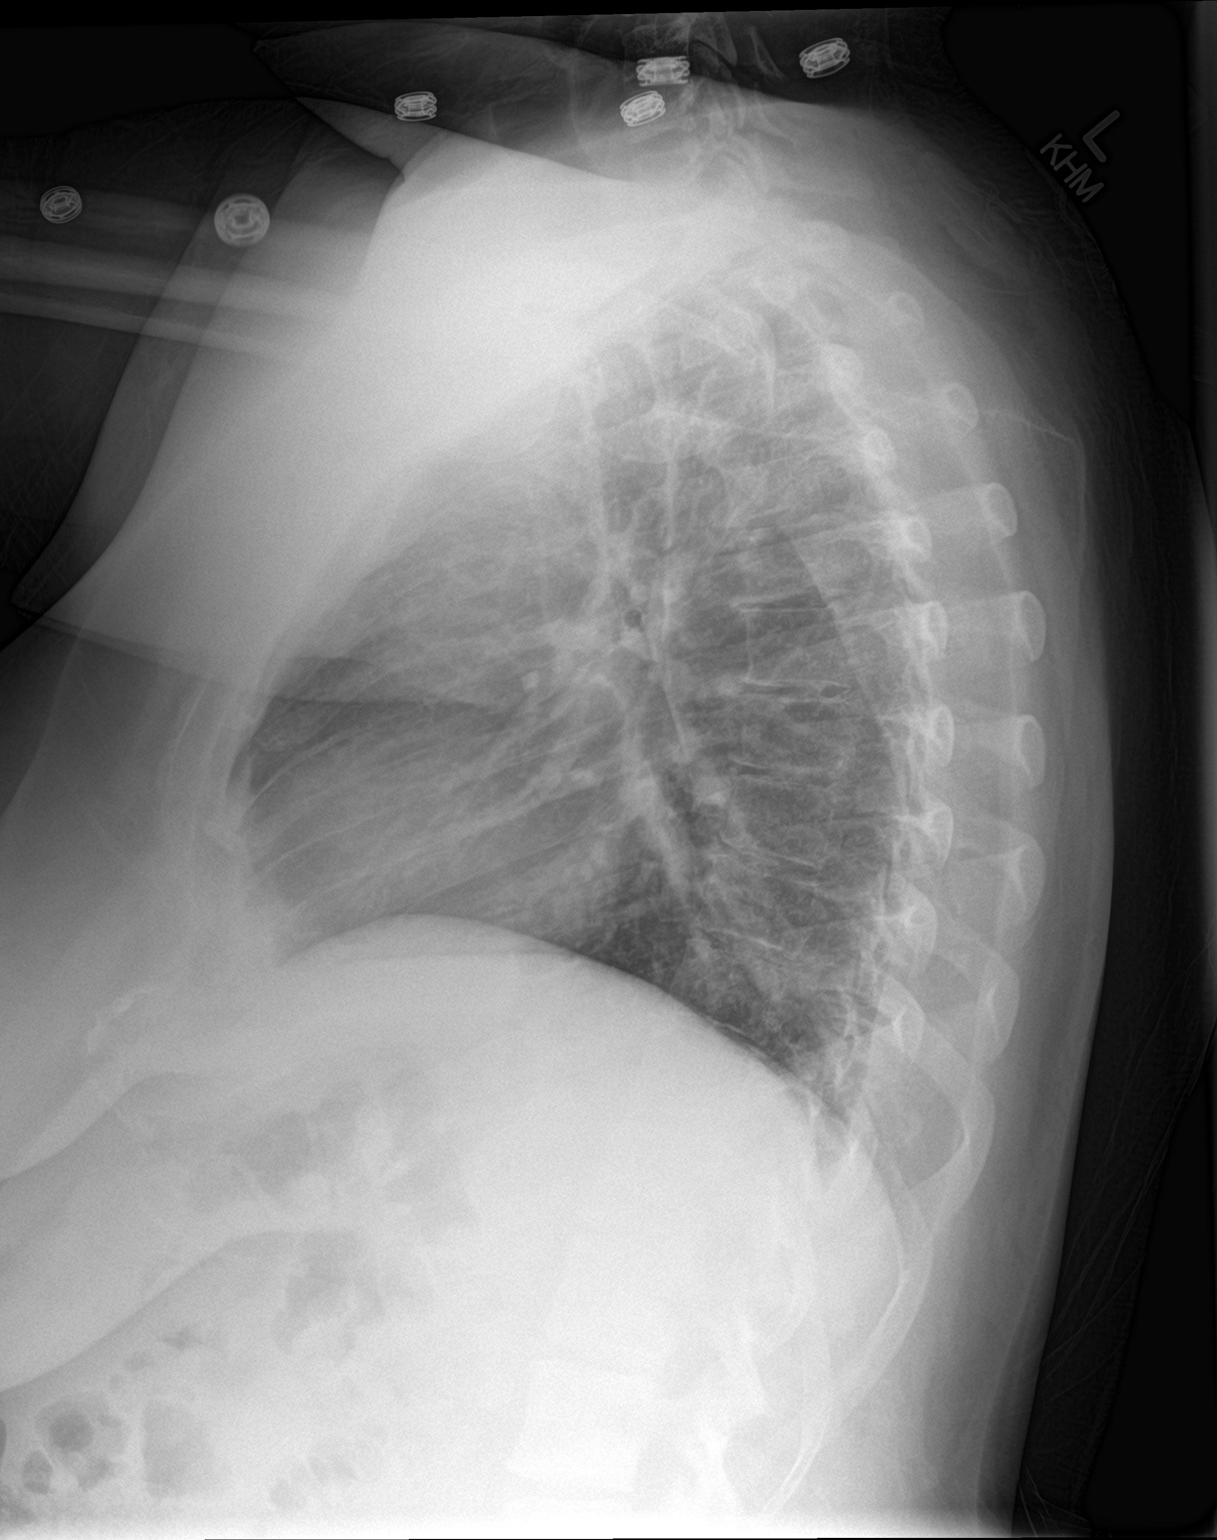

[2 of 2 positions shown; findings below may reference images not displayed]

FINDINGS: The heart size and mediastinal contours are within normal limits.
Both lungs are clear. The visualized skeletal structures are
unremarkable.
IMPRESSION: No active cardiopulmonary disease.
